# Patient Record
Sex: Female | Born: 1960 | Race: White | Hispanic: No | Marital: Married | Smoking: Never smoker
Health system: Southern US, Community
[De-identification: ages and names within clinical notes are randomized; demographics above are authoritative.]

## PROBLEM LIST (undated history)

## (undated) DIAGNOSIS — E079 Disorder of thyroid, unspecified: Secondary | ICD-10-CM

## (undated) DIAGNOSIS — G500 Trigeminal neuralgia: Secondary | ICD-10-CM

## (undated) DIAGNOSIS — T7840XA Allergy, unspecified, initial encounter: Secondary | ICD-10-CM

## (undated) DIAGNOSIS — N189 Chronic kidney disease, unspecified: Secondary | ICD-10-CM

## (undated) DIAGNOSIS — N200 Calculus of kidney: Secondary | ICD-10-CM

## (undated) DIAGNOSIS — M8580 Other specified disorders of bone density and structure, unspecified site: Secondary | ICD-10-CM

## (undated) HISTORY — DX: Trigeminal neuralgia: G50.0

## (undated) HISTORY — DX: Chronic kidney disease, unspecified: N18.9

## (undated) HISTORY — DX: Disorder of thyroid, unspecified: E07.9

## (undated) HISTORY — PX: LITHOTRIPSY: SUR834

## (undated) HISTORY — PX: OTHER SURGICAL HISTORY: SHX169

## (undated) HISTORY — DX: Allergy, unspecified, initial encounter: T78.40XA

## (undated) HISTORY — DX: Other specified disorders of bone density and structure, unspecified site: M85.80

## (undated) HISTORY — PX: CHOLECYSTECTOMY: SHX55

## (undated) HISTORY — DX: Calculus of kidney: N20.0

---

## 1998-09-10 ENCOUNTER — Other Ambulatory Visit: Admission: RE | Admit: 1998-09-10 | Discharge: 1998-09-10 | Payer: Self-pay | Admitting: *Deleted

## 1999-06-16 ENCOUNTER — Encounter (INDEPENDENT_AMBULATORY_CARE_PROVIDER_SITE_OTHER): Payer: Self-pay | Admitting: Specialist

## 1999-06-16 ENCOUNTER — Inpatient Hospital Stay (HOSPITAL_COMMUNITY): Admission: AD | Admit: 1999-06-16 | Discharge: 1999-06-19 | Payer: Self-pay | Admitting: Obstetrics and Gynecology

## 1999-07-14 ENCOUNTER — Other Ambulatory Visit: Admission: RE | Admit: 1999-07-14 | Discharge: 1999-07-14 | Payer: Self-pay | Admitting: Obstetrics and Gynecology

## 2000-07-26 ENCOUNTER — Other Ambulatory Visit: Admission: RE | Admit: 2000-07-26 | Discharge: 2000-07-26 | Payer: Self-pay | Admitting: Obstetrics and Gynecology

## 2000-07-31 ENCOUNTER — Encounter: Payer: Self-pay | Admitting: Obstetrics and Gynecology

## 2000-07-31 ENCOUNTER — Ambulatory Visit (HOSPITAL_COMMUNITY): Admission: RE | Admit: 2000-07-31 | Discharge: 2000-07-31 | Payer: Self-pay | Admitting: Obstetrics and Gynecology

## 2000-08-03 ENCOUNTER — Encounter: Admission: RE | Admit: 2000-08-03 | Discharge: 2000-08-03 | Payer: Self-pay | Admitting: Obstetrics and Gynecology

## 2000-08-03 ENCOUNTER — Encounter: Payer: Self-pay | Admitting: Obstetrics and Gynecology

## 2000-12-11 ENCOUNTER — Ambulatory Visit (HOSPITAL_COMMUNITY): Admission: RE | Admit: 2000-12-11 | Discharge: 2000-12-11 | Payer: Self-pay | Admitting: *Deleted

## 2001-08-09 ENCOUNTER — Other Ambulatory Visit: Admission: RE | Admit: 2001-08-09 | Discharge: 2001-08-09 | Payer: Self-pay | Admitting: Obstetrics and Gynecology

## 2001-08-16 ENCOUNTER — Encounter: Payer: Self-pay | Admitting: Obstetrics and Gynecology

## 2001-08-16 ENCOUNTER — Encounter: Admission: RE | Admit: 2001-08-16 | Discharge: 2001-08-16 | Payer: Self-pay | Admitting: Obstetrics and Gynecology

## 2002-08-24 ENCOUNTER — Other Ambulatory Visit: Admission: RE | Admit: 2002-08-24 | Discharge: 2002-08-24 | Payer: Self-pay | Admitting: Obstetrics and Gynecology

## 2003-04-07 ENCOUNTER — Ambulatory Visit (HOSPITAL_COMMUNITY): Admission: RE | Admit: 2003-04-07 | Discharge: 2003-04-07 | Payer: Self-pay | Admitting: Endocrinology

## 2003-04-18 ENCOUNTER — Encounter: Admission: RE | Admit: 2003-04-18 | Discharge: 2003-04-18 | Payer: Self-pay | Admitting: Obstetrics and Gynecology

## 2003-11-09 ENCOUNTER — Other Ambulatory Visit: Admission: RE | Admit: 2003-11-09 | Discharge: 2003-11-09 | Payer: Self-pay | Admitting: Obstetrics and Gynecology

## 2004-03-14 ENCOUNTER — Ambulatory Visit (HOSPITAL_COMMUNITY): Admission: RE | Admit: 2004-03-14 | Discharge: 2004-03-14 | Payer: Self-pay | Admitting: Endocrinology

## 2004-06-03 ENCOUNTER — Inpatient Hospital Stay (HOSPITAL_COMMUNITY): Admission: RE | Admit: 2004-06-03 | Discharge: 2004-06-10 | Payer: Self-pay | Admitting: Neurosurgery

## 2004-06-03 ENCOUNTER — Encounter (INDEPENDENT_AMBULATORY_CARE_PROVIDER_SITE_OTHER): Payer: Self-pay | Admitting: Specialist

## 2004-12-29 ENCOUNTER — Ambulatory Visit (HOSPITAL_COMMUNITY): Admission: RE | Admit: 2004-12-29 | Discharge: 2004-12-29 | Payer: Self-pay | Admitting: Obstetrics and Gynecology

## 2006-11-03 ENCOUNTER — Ambulatory Visit (HOSPITAL_COMMUNITY): Admission: RE | Admit: 2006-11-03 | Discharge: 2006-11-03 | Payer: Self-pay | Admitting: Obstetrics and Gynecology

## 2007-04-26 ENCOUNTER — Ambulatory Visit (HOSPITAL_COMMUNITY): Payer: Self-pay | Admitting: Psychiatry

## 2007-05-23 ENCOUNTER — Ambulatory Visit (HOSPITAL_COMMUNITY): Payer: Self-pay | Admitting: Psychiatry

## 2007-11-14 ENCOUNTER — Ambulatory Visit (HOSPITAL_COMMUNITY): Admission: RE | Admit: 2007-11-14 | Discharge: 2007-11-14 | Payer: Self-pay | Admitting: Obstetrics and Gynecology

## 2007-11-28 ENCOUNTER — Encounter: Admission: RE | Admit: 2007-11-28 | Discharge: 2007-11-28 | Payer: Self-pay | Admitting: Obstetrics and Gynecology

## 2008-05-02 ENCOUNTER — Ambulatory Visit (HOSPITAL_COMMUNITY): Payer: Self-pay | Admitting: Psychiatry

## 2008-10-24 ENCOUNTER — Ambulatory Visit (HOSPITAL_COMMUNITY): Payer: Self-pay | Admitting: Psychiatry

## 2009-02-11 ENCOUNTER — Encounter: Admission: RE | Admit: 2009-02-11 | Discharge: 2009-02-11 | Payer: Self-pay | Admitting: Obstetrics and Gynecology

## 2009-06-21 ENCOUNTER — Ambulatory Visit (HOSPITAL_COMMUNITY): Payer: Self-pay | Admitting: Psychiatry

## 2009-07-10 ENCOUNTER — Ambulatory Visit: Payer: Self-pay | Admitting: Radiology

## 2009-07-10 ENCOUNTER — Ambulatory Visit (HOSPITAL_BASED_OUTPATIENT_CLINIC_OR_DEPARTMENT_OTHER): Admission: RE | Admit: 2009-07-10 | Discharge: 2009-07-10 | Payer: Self-pay | Admitting: Obstetrics and Gynecology

## 2010-06-22 ENCOUNTER — Encounter: Payer: Self-pay | Admitting: Obstetrics and Gynecology

## 2010-06-23 ENCOUNTER — Encounter: Payer: Self-pay | Admitting: Obstetrics and Gynecology

## 2010-07-25 ENCOUNTER — Ambulatory Visit (HOSPITAL_COMMUNITY): Admission: RE | Admit: 2010-07-25 | Payer: 59 | Source: Ambulatory Visit | Admitting: Obstetrics and Gynecology

## 2010-09-03 ENCOUNTER — Ambulatory Visit (HOSPITAL_COMMUNITY): Payer: 59 | Admitting: Physician Assistant

## 2010-09-03 DIAGNOSIS — F988 Other specified behavioral and emotional disorders with onset usually occurring in childhood and adolescence: Secondary | ICD-10-CM

## 2010-10-08 ENCOUNTER — Ambulatory Visit (HOSPITAL_COMMUNITY): Payer: 59 | Admitting: Physician Assistant

## 2010-10-17 NOTE — H&P (Signed)
[facility]  Patient:    Gabriella Kerr                         MRN: [id number] Adm. Date:  [id number] Attending:  Lenoard Aden CC:         Lenoard Aden, M.D.                         History and Physical  CHIEF COMPLAINT:  Elective repeat cesarean section, desire for elective sterilization.  HISTORY OF PRESENT ILLNESS:  The patient is a 50 year old white female, G3, P2, EDD of June 30, 1999 at 28 to 29 weeks with desire for elective repeat C-section.  PAST MEDICAL HISTORY:  Remarkable for C-section x 2 in 1992 and 1994.  7 pound  ounce female in 1992, emergency fetal distress, failure to progress.  1998 8 pound 11 ounce female.  GYN HISTORY:  Remarkable for HPV.  ALLERGIES:  PENICILLIN.  MEDICATIONS ON ADMISSION:  Prenatal vitamins.  FAMILY HISTORY:  Thrombophlebitis and hypothyroidism.  The patient is a person ith an old history of nephrolithiasis.  PREGNANCY HISTORY:  Remarkable for blood type of A positive, Rh antibody negative. VDRL nonreactive.  Rubella immune.  Hepatitis B surface antigen negative. Group B strep performed was negative.  Pregnancy uncomplicated.  PHYSICAL EXAMINATION:  GENERAL:  The patient is a well-developed, well-nourished white female in no acute distress.  HEENT:  Normal.  LUNGS:  Clear.  HEART:  Regular rate and rhythm.  ABDOMEN:  Soft, gravid, nontender.  Estimated fetal weight 8 pounds.  No CVA tenderness.  Cervix is closed 3 cm long.  Vertex at -3.  EXTREMITIES:  No cords.  NEUROLOGIC:  Nonfocal.  IMPRESSION:  A 38-week intrauterine pregnancy with desire for elective repeat C-section.  PLAN:  Proceed with elective repeat C-section and tubal ligation.  Risks of anesthesia, infection, bleeding, injury to abdominal organs and need for repair was discussed.  Failure risks of tubal ligation of 5 to 10 per 1000 noted.  Patient  acknowledges and desires to proceed. DD:   06/16/99 TD:  06/16/99 Job: [id number]

## 2010-10-17 NOTE — Op Note (Signed)
[name], Gabriella NO.:  [id number]   MEDICAL RECORD NO.:  [id number]          PATIENT TYPE:  INP   LOCATION:  2899                         FACILITY:  [facility]   PHYSICIAN:  Hilda Lias, M.D.   DATE OF BIRTH:  [birth year]   DATE OF PROCEDURE:  06/03/2004  DATE OF DISCHARGE:                                 OPERATIVE REPORT   PREOPERATIVE DIAGNOSIS:  Pituitary tumor, right side, prolactinoma.   POSTOPERATIVE DIAGNOSIS:  Pituitary tumor, right side, prolactinoma.   PROCEDURE:  Transsphenoidal resection of pituitary adenoma, 10 x 10 x 12,  right side of the pituitary gland.  Microscope.  C-arm.   Gabriella Kerr is a lady who is admitted because of headache, breast discharge,  irregularity of her menstrual periods for more than four years.  The patient  had been taking medication, but the medication was making her sick.  She has  an MRI which showed adenoma in the right side of the pituitary gland.  The  patient wanted to proceed with surgery because she was unable to take  medication.  The risks were explained in the history and physical.   PROCEDURE:  The patient was taken to the O.R., and after intubation three  pins were applied to the head.  She was positioned in a semi-sitting  position.  C-arm was used, and we were able to localize the sella.  Then,  Dr. Lazarus Salines from the ENT service took over and opened the area.  He did the  approach into the sella.  From then on, I took over.  Indeed, the bone was  really thick, and we drilled part of the floor of the sella.  Then, using  the 1 and 2 mm Kerrison punch, we opened the sella about 10 x 10.  Then,  immediately we found in the inferior part of the right side of the pituitary  gland there was a discoloration which was different from the rest of the  area.  A cruciate incision was done in the dural matter, and the capsule was  opened.  Indeed, we found after we did the retraction there was an area  which was a  little bit of a dark color, different from the normal pituitary  gland.  Dissection was done around the capsule until we were able to remove  the tumor.  The tumor was not removed completely because part of the tumor  was necrotic.  Although we were unable to remove the tumor at once,  nevertheless then pulling out the pseudomembrane as well as the tumor itself  using microcurets with different angles, we were able to remove completely  the whole adenoma.  At the end, we were able to introduce a sponge.  Although I was doing surgery from the right side, I switched to the left  side and removed more tumor going lateral.  At the end, we had good removal  of adenoma.  Tissue came back.  It was a pituitary adenoma.  Gelfoam was  inserted into the area of the resection.  We kept it for at least  five  minutes.  At the end, we accomplished good hemostasis.  The Gelfoam ws  removed.  The area was irrigated.  There was no evidence of any CSF leak  after we did a Valsalva maneuver.  After that, we went into the right side  of the abdomen, and through the skin and subcutaneous tissue we removed a  piece of fat.  The area was irrigated, and hemostasis was done with bipolar.  We closed the wound using Vicryl.  Then, we  took a piece of the nasal bone, and we made a floor for the sella.  It was  introduced without any problem.  After that, we put a little bit of amount  of fat to seal off the place completely, and then the area was kept in place  using Tisseel.  From then on, Dr. Lazarus Salines took over to close the area.  The  patient did well.       EB/MEDQ  D:  06/03/2004  T:  06/03/2004  Job:  [id number]   cc:   Zola Button T. Lazarus Salines, M.D.  [address] [place]  [place] [address]  Fax: [phone]   Alfonse Alpers. Dagoberto Ligas, M.D.  [address], [address]  [place]  [place] [address]  Fax: [phone]

## 2010-10-17 NOTE — H&P (Signed)
NAMEAISLIN, Gabriella Kerr                 ACCOUNT NO.:  [id number]   MEDICAL RECORD NO.:  [id number]          PATIENT TYPE:  INP   LOCATION:  NA                           FACILITY:  [facility]   PHYSICIAN:  Hilda Lias, M.D.   DATE OF BIRTH:  1960-12-29   DATE OF ADMISSION:  06/03/2004  DATE OF DISCHARGE:                                HISTORY & PHYSICAL   Mrs. Bouler is a lady who has been complaining of a problem with her  menstrual periods, having __________ on the breasts, and lack of sexual  drive.  This problem started about 4-1/2 years ago after she had a daughter.  Associated with that, she had been complaining of a headache.  She had a  workup which showed that she has a pituitary tumor, and she was started on  Parlodel.  The patient had been unable to take the medication because that  increases the pain.  Because of the findings, she had been seen by the  endocrinologist, and she was advised to have surgery.   PAST MEDICAL HISTORY:  1.  C-section.  2.  Cholecystectomy.  3.  Rhinoplasty.   She is allergic to PENICILLIN.   She is taking Topamax; Effexor; Toradol; Imitrex; Demerol as needed; Maxalt;  and Vicodin.   SOCIAL HISTORY:  Negative.   FAMILY HISTORY:  Mother is 16 years old, overweight, and with high blood  pressure.   REVIEW OF SYSTEMS:  Positive for increase of urination, nipple discharge,  and lack of sexual drive.   PHYSICAL EXAMINATION:  HEENT:  Normal.  NECK:  Normal.  LUNGS: Clear.  HEART SOUNDS:  Normal.  ABDOMEN:  Normal.  EXTREMITIES:  Normal pulses.  NEUROLOGIC:  Mental status and cranial nerves normal.  Strength normal.  Sensation normal. Coordination normal.  BREASTS:  I did not test the breasts.  GYN:  No gynecological examination was performed.   The MRI showed that indeed in the right side of the gland she has a 10 x 9 x  8 mm tumor with low density with no compromise of the optic nerve.   CLINICAL IMPRESSION:  Pituitary tumor.   Prolactinoma.   RECOMMENDATIONS:  The patient wants to proceed with surgery.  The patient  was seen by Dr. Dagoberto Ligas as well as by Dr. Lazarus Salines from the ENT service.  She  wants to proceed with resection of the tumor here in [place].  The  complications were explained to her and her husband who was present, which  consisted of inability to remove the tumor, damage to the carotid artery,  damage to the optic nerve, sinusitis, meningitis, CSF leak, and need for  further surgery.  The patient declined second opinion.       EB/MEDQ  D:  06/03/2004  T:  06/03/2004  Job:  [id number]

## 2010-10-17 NOTE — Discharge Summary (Signed)
[name], [name]                 ACCOUNT NO.:  [id number]   MEDICAL RECORD NO.:  [id number]          PATIENT TYPE:  INP   LOCATION:  3022                         FACILITY:  [facility]   PHYSICIAN:  Hilda Lias, M.D.   DATE OF BIRTH:  22-Apr-1961   DATE OF ADMISSION:  06/03/2004  DATE OF DISCHARGE:  06/10/2004                                 DISCHARGE SUMMARY   PREOPERATIVE DIAGNOSES:  1.  Pituitary tumor.  2.  Right side adenoma.   POSTOPERATIVE DIAGNOSES:  1.  Pituitary tumor.  2.  Right side adenoma.   CLINICAL HISTORY:  Ms. Gabriella Kerr is a lady who was admitted because of headache,  discharge from her breast, irregularity to her menstrual period.  Patient  was given some Parlodel but she became sick with that medication giving more  headache.  Because of that, it was decided to take her to surgery.  The MRI  showed that she has a right-sided adenoma about 10 x 8 mm in the right side.  There was no displacement of the optic nerve.   LABORATORY:  Within normal limits.   COURSE IN THE HOSPITAL:  The patient was taken to surgery on June 03, 2004, and a transseptal, transsphenoidal resection of a right side pituitary  adenoma was made.  After surgery the patient was kept in the intensive care  unit and later on transferred to the floor.  At the present time she is  doing great, she has no headache.  There is no evidence of any diabetes  insipidus and she is ready to be discharged today.   CONDITION ON DISCHARGE:  Improved.   MEDICATION:  Percocet for pain.  She is going to continue taking her  medication she was using prior to being admitted to the hospital.   DIET:  Regular.   ACTIVITY:  She is not to do any lifting, she can drive.   FOLLOW UP:  I will be seeing her in my office in 4-6 weeks.  She was also  encouraged to set up an appointment with Dr. Dagoberto Ligas as well as with Dr.  __________.       EB/MEDQ  D:  06/10/2004  T:  06/10/2004  Job:  [id number]   cc:   Alfonse Alpers.  Dagoberto Ligas, M.D.  [address]  [place]  [place] [address]  Fax: [phone]   Maryla Morrow. Modesto Charon, M.D.  [address]  [place]  [place] [address]  Fax: [phone]   Lenoard Aden, M.D.  [address]  [place]  [place] [address]  Fax: [phone]

## 2010-10-17 NOTE — Discharge Summary (Signed)
[facility]  Patient:    Gabriella Kerr                         MRN: [id number] Adm. Date:  [id number] Disc. Date: [id number] Attending:  Lenoard Aden                           Discharge Summary  ADMISSION DIAGNOSES:          1. Previous C-section x 2.                               2. At term; for elective repeat and elective tubal                                  sterilization.  HOSPITAL COURSE:              Patient underwent uncomplicated elective repeat C-section and bilateral partial salpingectomy, June 16, 1999.  Postpartum course uncomplicated.  Tolerated regular diet well.  Hemoglobin and hematocrit within normal limits.  Discharged to home day #3.  DISCHARGE MEDICATIONS:        Prenatal vitamins, iron, Tylox and Motrin given.  DISCHARGE INSTRUCTIONS:       Discharge teaching done.  FOLLOWUP:                     Follow up in the office in four to six weeks. DD:  07/17/99 TD:  07/18/99 Job: [id number]

## 2010-10-17 NOTE — Op Note (Signed)
[facility]  Patient:    Gabriella Kerr                         MRN: [id number] Proc. Date: 06/16/99 Adm. Date:  [id number] Attending:  Lenoard Aden CC:         [facility]                           Operative Report  PREOPERATIVE DIAGNOSES:       Thirty-eight-week intrauterine pregnancy; previous cesarean section x 2; desire for elective sterilization.  POSTOPERATIVE DIAGNOSES:      Thirty-eight-week intrauterine pregnancy; previous cesarean section x 2; desire for elective sterilization.  PROCEDURE:                    Repeat low segment transverse cesarean section and bilateral partial salpingectomy.  SURGEON:                      Lenoard Aden, M.D.  ASSISTANT:                    Hayes Ludwig, N.P.  ANESTHESIA:  ESTIMATED BLOOD LOSS:         1000 cc.  COMPLICATIONS:                None.  DRAINS:                       Foley.  COUNTS:                       Correct.  FINDINGS:                     Full-term living female, Apgars 8/9.  Placenta manual, intact, three-vessel cord; with normal tubes and ovaries.  SPECIMEN:                     Tubal segments x 2 to pathology.  DISPOSITION:                  Patient to recovery in good condition.  DESCRIPTION OF PROCEDURE:     After being apprised of risks of anesthesia, infection and bleeding, failure risk of tubal ligation of 5 to 10 per thousand,  patient is brought to the operating room where she is administered spinal anesthetic without complications and prepped and draped in the usual sterile fashion and Foley catheter placed.  After achieving adequate anesthesia, Pfannenstiel skin incision is made and carried down to the fascia, which is nicked in the midline, and opened transversely using Mayo scissors.  Rectus muscle are  dissected sharply in the midline, peritoneum entered sharply, and bladder blade  placed.  The visceroperitoneum was scored in a smile-like fashion  and uterus is  scored, making a Kerr hysterotomy incision, for a vacuum-assisted delivery of a  7-pound 11-ounce female, Apgars 8/9, left occipitotransverse position, handed to pediatricians who are in attendance.  Placenta delivered manually and intact; three-vessel cord noted.  Uterus exteriorized; normal tubes and ovaries noted.  Uterus curetted using a dry lap pack and closed using a 0 Monocryl in a continuous-running fashion.  Bladder flap inspected.  Ampullary-isthmic portion of the right tube is grasped using a Babcock clamp and an avascular portion of the  mesosalpinx is cauterized, creating a window.  Proximal  and distal ties of 0 plain are placed and good hemostasis is achieved as the tubal segment is removed and oth tubal ends are cauterized.  The same procedure is done on the left side and tubal segment is removed and sent to pathology for confirmation.  At this time, good hemostasis having been achieved, paracolic gutters are irrigated and all blood clots are subsequently removed.  Fascia is then closed using a 0 Vicryl in a continuous-running fashion and skin closed using staples.  Dilute Marcaine solution placed, staples placed.  Patient tolerates procedure well and is transferred to  recovery in good condition.DD:  06/16/99 TD:  06/16/99 Job: [id number]

## 2010-10-17 NOTE — Op Note (Signed)
Gabriella Kerr, PLUTA                 ACCOUNT NO.:  [id number]   MEDICAL RECORD NO.:  [id number]          PATIENT TYPE:  INP   LOCATION:  2899                         FACILITY:  [facility]   PHYSICIAN:  Zola Button T. Lazarus Salines, M.D. DATE OF BIRTH:  1960-11-13   DATE OF PROCEDURE:  06/03/2004  DATE OF DISCHARGE:                                 OPERATIVE REPORT   PREOPERATIVE DIAGNOSIS:  Pituitary tumor. Hypertrophic inferior turbinates.   POSTOPERATIVE DIAGNOSIS:  Pituitary tumor. Hypertrophic inferior turbinates.   PROCEDURE PERFORMED:  Transnasal, transseptal transsphenoidal approach to  the hypophysis. Bilateral inferior turbinates.   SURGEON:  Gloris [place]. Lazarus Salines, M.D.   ANESTHESIA:  General orotracheal.   BLOOD LOSS:  200 cc.   COMPLICATIONS:  None.   FINDINGS:  Basically straight septum. A congested inferior turbinates  bilateral. A clear sphenoid sinus with a intersinus septum off midline  towards the left. No evidence of bony erosion of the floor of the sella.   PROCEDURE:  With the patient placed in the head tongs and positioned in  front of the C-arm apparatus, under general orotracheal anesthesia,  otolaryngology began their portion of the procedure. A saline moistened  throat pack was placed. Nasal vibrissae were trimmed. Cocaine crystals, 200  mg total were applied on cotton carriers to the anterior ethmoid and  sphenopalatine ganglion regions on both sides. Cocaine solution, 160  milligrams total was applied on one half x 3 inch cottonoids to both sides  of the septal mucosa. 1% Xylocaine with 1:100,000 epinephrine, 15 mL  total  infiltrated into the nasal spine region, into the anterior floor of the  nose, into the transcolumellar incision site, into the membranous columella,  and some mucoperichondrial plane of the septum on both sides. Several  minutes were allowed for  this to take effect. A sterile preparation and  draping of the midface was accomplished.   The materials  were removed from the nose and appeared to be intact and  correct in number. The findings were as described above. A left-sided  hemitransfixion incision was sharply executed and carried down to the caudal  edge of the quadrangular cartilage and continued on into a floor incision. A  small right-sided floor incision was executed. Floor tunnels were elevated  on both sides. The sub mucoperichondrial plane of the left septum was  elevated, carried back to the perpendicular plate of ethmoid, brought  downward to the vomer and brought forward and communicated with the floor  tunnel. The left flap was elevated intact. The contour ethmoid junction was  identified and opened with the Cottle elevator. The opposite  submucoperiosteal plane of the septum was elevated from the superior  perpendicular plate down to the dorsum of the vomer. The superior  perpendicular plate of the ethmoid was laced with an open Jansen-Middleton  forceps.  The inferior portion was dissected and rocked free with closed  Jansen-Middleton forceps and with Lenoria Chime forceps piecemeal. The posterior  inferior corner of the quadrangular cartilage was submucosally resected. The  cartilage was dislocated from the maxillary crest and swung over to the  right  where it communicated with the floor tunnel.   The septum was replaced in midline. The columella was carefully cleaned. A  transcolumellar incision was planned low on the columella with a central V  to prevent scar contracture. This was sharply executed and carried into the  soft tissue of the columella and beneath the feet of the medial crurae of  the lower lateral cartilages. The columella was transected and this is line  of incision was communicated with the right floor tunnel incision. This  allowed the septum and columella to be swung into the right nose and the  septal flap into the left side. This allowed access to the deep nose. Using  a Killian  speculum, the  dissection was carried back to the rostrum of the  sphenoid where the mucosa was cleared away from the face of the sphenoid.  This Killian speculum was removed and the [facility] retractor was placed and  expanded for visualization. It was stable. Under direct vision, using  mallet and osteotome, the rostrum of the sphenoid was opened and removed.  The sphenoid sinus was visualized. The bony opening was enlarged with  Takahashi forceps and Kerrison rongeurs. The intrasinus septum was broken  down with the [facility] forceps. The mucosa from the right sphenoid sinus  was carefully elevated and stripped going from medially to laterally. The  left sphenoid sinus was substantially smaller and was impossible to get a  good dissection of the mucosa so it was incompletely stripped. At this point  the anterior face of the sella was identified. A suction tip was placed  against this area, and positioning was confirmed with C-arm. At this point  the procedure was turned over to neurosurgery for removal of the pituitary  tumor.   After complete removal of the pituitary, and placement of a small amount of  fat and bone chips in the sella and the same in the sinus, the procedure was  returned otolaryngology. A small amount of blood was suctioned free of the  septal tunnels. The Hardy retractor was relaxed and removed. There was a  small linear rent in the posterior inferior right septal flap.   The columella was replaced and secured with interrupted 4-0 chromic sutures  in the deep tissues and with 4-0 chromic suture in the vestibule and 6-0  Ethilon suture externally in a cosmetic fashion. The septum was replaced in  the nasal spine region and secured thereto with a 4-0 PDS suture. The septal  tunnels were once again suctioned free and the several incisions were closed  with interrupted 4-0 chromic suture. A good straight midline configuration of the septum was accomplished.   Just prior to completing  the septoplasty, the inferior turbinates were  infiltrated with a total of 6 mL  of 1% Xylocaine with 1:100,000 epinephrine  using a 25 gauge spinal needle. After completing the septoplasty, beginning  on the right side, the anterior hood of the inferior turbinate was laced to  span nasal valve region. The medial mucosa was incised and anterior  upsloping fashion and a laterally based flap was developed. The turbinate  was infractured. Using angled turbinate scissors, the turbinate bone and  lateral mucosa was resected in a posterior downsloping fashion taking  virtually all the anterior pole and leaving virtually all the posterior  pole. Bony spicules were removed. The cut mucosal edges were suction  coagulated for hemostasis. The flap was laid back down, the turbinate was  outfractured, and this side was completed.  The left side was done in  identical fashion.   At this point 0.040 reinforced silastic splints were fashioned and placed  against the septum on both sides and secured thereto with a 3-0 Ethilon  stitch. Triple thickness Telfa packs impregnated with bacitracin ointment  and with a 2-0 silk tag were placed high in the nose up against the rostrum  of the sphenoid. Additional triple thickness Telfa packs without the silk  tag were placed low in the nose on each side to provide some compression  hemostasis of the turbinates and septum. Hemostasis was observed. At this  point the pharynx was suctioned free and throat pack was removed. Again  hemostasis was observed. At this point, the procedure was to returned to  neurosurgery for removal of the head tongs and then to anesthesia.   COMMENT:  A 50 year old white female with a presumed prolactinoma but with  intolerance of bromocriptine was the indication for today's procedure.  Anticipated routine postoperative recovery with attention to ice, elevation,  analgesia, antistaphylococcal antibiosis. Will remove the nasal packs in   seven days. Nasal splints in 10 days.      [place]   KTW/MEDQ  D:  06/03/2004  T:  06/03/2004  Job:  [id number]   cc:   Hilda Lias, M.D.  [address]  [place], [place] [address]  Fax: [phone]   Maryla Morrow. Modesto Charon, M.D.  [address]  [place]  [place] [address]  Fax: [phone]   Lenoard Aden, M.D.  [address]  [place]  [place] [address]  Fax: [phone]

## 2010-11-17 ENCOUNTER — Other Ambulatory Visit (HOSPITAL_BASED_OUTPATIENT_CLINIC_OR_DEPARTMENT_OTHER): Payer: Self-pay | Admitting: Obstetrics and Gynecology

## 2010-11-17 DIAGNOSIS — Z1231 Encounter for screening mammogram for malignant neoplasm of breast: Secondary | ICD-10-CM

## 2010-11-18 ENCOUNTER — Ambulatory Visit (HOSPITAL_BASED_OUTPATIENT_CLINIC_OR_DEPARTMENT_OTHER)
Admission: RE | Admit: 2010-11-18 | Discharge: 2010-11-18 | Disposition: A | Discharge status: Home / Self Care | Payer: 59 | Source: Ambulatory Visit | Attending: Obstetrics and Gynecology | Admitting: Obstetrics and Gynecology

## 2010-11-18 DIAGNOSIS — Z1231 Encounter for screening mammogram for malignant neoplasm of breast: Secondary | ICD-10-CM

## 2011-01-24 ENCOUNTER — Emergency Department (HOSPITAL_BASED_OUTPATIENT_CLINIC_OR_DEPARTMENT_OTHER)
Admission: EM | Admit: 2011-01-24 | Discharge: 2011-01-25 | Disposition: A | Discharge status: Home / Self Care | Payer: 59 | Attending: Emergency Medicine | Admitting: Emergency Medicine

## 2011-01-24 ENCOUNTER — Encounter: Payer: Self-pay | Admitting: *Deleted

## 2011-01-24 DIAGNOSIS — R110 Nausea: Secondary | ICD-10-CM | POA: Insufficient documentation

## 2011-01-24 DIAGNOSIS — R109 Unspecified abdominal pain: Secondary | ICD-10-CM | POA: Insufficient documentation

## 2011-01-24 DIAGNOSIS — N201 Calculus of ureter: Secondary | ICD-10-CM | POA: Insufficient documentation

## 2011-01-24 MED ORDER — ONDANSETRON HCL 4 MG/2ML IJ SOLN
4.0000 mg | Freq: Once | INTRAMUSCULAR | Status: AC
  Administered 2011-01-25: 4 mg via INTRAVENOUS
  Filled 2011-01-24: qty 2

## 2011-01-24 MED ORDER — SODIUM CHLORIDE 0.9 % IV BOLUS (SEPSIS)
500.0000 mL | Freq: Once | INTRAVENOUS | Status: AC
  Administered 2011-01-25: via INTRAVENOUS

## 2011-01-24 MED ORDER — MORPHINE SULFATE 4 MG/ML IJ SOLN
4.0000 mg | Freq: Once | INTRAMUSCULAR | Status: AC
  Administered 2011-01-25: 4 mg via INTRAVENOUS
  Filled 2011-01-24: qty 1

## 2011-01-24 NOTE — ED Notes (Signed)
Abd pain since 11am. LLQ, LRQ. Radiates to back towards left. Tender upon palpation.

## 2011-01-25 ENCOUNTER — Emergency Department (INDEPENDENT_AMBULATORY_CARE_PROVIDER_SITE_OTHER): Payer: 59

## 2011-01-25 DIAGNOSIS — N201 Calculus of ureter: Secondary | ICD-10-CM

## 2011-01-25 DIAGNOSIS — N1330 Unspecified hydronephrosis: Secondary | ICD-10-CM

## 2011-01-25 DIAGNOSIS — R109 Unspecified abdominal pain: Secondary | ICD-10-CM

## 2011-01-25 DIAGNOSIS — N200 Calculus of kidney: Secondary | ICD-10-CM

## 2011-01-25 LAB — URINALYSIS, ROUTINE W REFLEX MICROSCOPIC
Glucose, UA: NEGATIVE mg/dL
Leukocytes, UA: NEGATIVE
Nitrite: NEGATIVE
Specific Gravity, Urine: 1.025 (ref 1.005–1.030)
pH: 7 (ref 5.0–8.0)

## 2011-01-25 LAB — CBC
HCT: 35.2 % — ABNORMAL LOW (ref 36.0–46.0)
Hemoglobin: 11.8 g/dL — ABNORMAL LOW (ref 12.0–15.0)
RDW: 12.6 % (ref 11.5–15.5)
WBC: 7.8 10*3/uL (ref 4.0–10.5)

## 2011-01-25 LAB — DIFFERENTIAL
Basophils Absolute: 0 10*3/uL (ref 0.0–0.1)
Lymphocytes Relative: 11 % — ABNORMAL LOW (ref 12–46)
Monocytes Absolute: 0.4 10*3/uL (ref 0.1–1.0)
Neutro Abs: 6.5 10*3/uL (ref 1.7–7.7)
Neutrophils Relative %: 84 % — ABNORMAL HIGH (ref 43–77)

## 2011-01-25 LAB — BASIC METABOLIC PANEL
CO2: 21 mEq/L (ref 19–32)
Chloride: 110 mEq/L (ref 96–112)
Creatinine, Ser: 0.8 mg/dL (ref 0.50–1.10)
GFR calc Af Amer: >60 mL/min (ref >60)
Potassium: 3.7 mEq/L (ref 3.5–5.1)
Sodium: 140 mEq/L (ref 135–145)

## 2011-01-25 LAB — URINE MICROSCOPIC-ADD ON

## 2011-01-25 LAB — PREGNANCY, URINE: Preg Test, Ur: NEGATIVE

## 2011-01-25 MED ORDER — IOHEXOL 300 MG/ML  SOLN
100.0000 mL | Freq: Once | INTRAMUSCULAR | Status: AC | PRN
  Administered 2011-01-25: 100 mL via INTRAVENOUS

## 2011-01-25 MED ORDER — HYDROMORPHONE HCL 1 MG/ML IJ SOLN
0.5000 mg | Freq: Once | INTRAMUSCULAR | Status: AC
  Administered 2011-01-25: 0.5 mg via INTRAVENOUS
  Filled 2011-01-25: qty 1

## 2011-01-25 MED ORDER — KETOROLAC TROMETHAMINE 30 MG/ML IJ SOLN
30.0000 mg | Freq: Once | INTRAMUSCULAR | Status: AC
  Administered 2011-01-25: 30 mg via INTRAVENOUS
  Filled 2011-01-25: qty 1

## 2011-01-25 MED ORDER — OXYCODONE-ACETAMINOPHEN 5-325 MG PO TABS: 2.0000 | ORAL_TABLET | ORAL | Status: AC | PRN

## 2011-01-25 MED ORDER — OXYCODONE-ACETAMINOPHEN 5-325 MG PO TABS
ORAL_TABLET | ORAL | Status: AC
  Filled 2011-01-25: qty 1

## 2011-01-25 MED ORDER — TAMSULOSIN HCL 0.4 MG PO CAPS: 0.4000 mg | ORAL_CAPSULE | ORAL | Status: DC

## 2011-01-25 MED ORDER — OXYCODONE-ACETAMINOPHEN 5-325 MG PO TABS
1.0000 | ORAL_TABLET | Freq: Once | ORAL | Status: AC
  Administered 2011-01-25: 1 via ORAL

## 2011-01-25 MED ORDER — ONDANSETRON 8 MG PO TBDP: 8.0000 mg | ORAL_TABLET | Freq: Three times a day (TID) | ORAL | Status: AC | PRN

## 2011-01-25 NOTE — ED Provider Notes (Signed)
History     CSN: [id number] Arrival date & time: 01/24/2011 11:46 PM  Chief Complaint  Patient presents with  . Abdominal Pain   HPI Comments: Pt with h/o cholecystectomy Reports gradual onset of illness today  Patient is a 49 y.o. female presenting with abdominal pain. The history is provided by the patient.  Abdominal Pain The primary symptoms of the illness include abdominal pain and nausea. The primary symptoms of the illness do not include fever, shortness of breath, vomiting, diarrhea, dysuria or vaginal bleeding. The current episode started 13 to 24 hours ago. The onset of the illness was gradual. The problem has been gradually worsening.  The patient states that she believes she is currently not pregnant. Additional symptoms associated with the illness include chills.    Past Medical History  Diagnosis Date  . Migraine     Past Surgical History  Procedure Date  . Cesarean section     History reviewed. No pertinent family history.  History  Substance Use Topics  . Smoking status: Never Smoker   . Smokeless tobacco: Not on file  . Alcohol Use: No    OB History    Grav Para Term Preterm Abortions TAB SAB Ect Mult Living                  Review of Systems  Constitutional: Positive for chills. Negative for fever.  Respiratory: Negative for shortness of breath.   Gastrointestinal: Positive for nausea and abdominal pain. Negative for vomiting and diarrhea.  Genitourinary: Negative for dysuria and vaginal bleeding.  All other systems reviewed and are negative.    Physical Exam  BP 121/64  Pulse 66  Temp(Src) 96.6 F (35.9 C) (Oral)  Resp 20  Ht 5\' 7"  (1.702 m)  Wt 135 lb (61.236 kg)  BMI 21.14 kg/m2  SpO2 100%  LMP 01/17/2011  Physical Exam  CONSTITUTIONAL: Well developed/well nourished, uncomfortable appearing HEAD AND FACE: Normocephalic/atraumatic EYES: EOMI/PERRL ENMT: Mucous membranes dry NECK: supple no meningeal signs SPINE:entire spine  nontender CV: S1/S2 noted, no murmurs/rubs/gallops noted LUNGS: Lungs are clear to auscultation bilaterally, no apparent distress ABDOMEN: soft but diffuse RLQ and LLQ tenderness noted, no rebound, no peritoneal signs GU:no cva tenderness NEURO: Pt is awake/alert, moves all extremitiesx4 EXTREMITIES: pulses normal, full ROM SKIN: warm, color normal PSYCH: no abnormalities of mood noted   ED Course  Procedures  MDM Nursing notes reviewed and considered in documentation All labs/vitals reviewed and considered - hematuria noted   12:12 AM pt ill appearing with diffuse abd pain.  Will likely need ct imaging   1:48 AM  Pt feeling improved, awaiting Ct imaging   Pt improved, taking PO Discussed need for close urology f/u in 24 hours as large stone noted, but pain controlled, no uti, no fever Pt agreeable with plan and we discussed strict return precautions 4:22 AM   Joya Gaskins, MD 01/25/11 [phone]

## 2011-01-29 ENCOUNTER — Ambulatory Visit (HOSPITAL_COMMUNITY)
Admission: RE | Admit: 2011-01-29 | Discharge: 2011-01-29 | Disposition: A | Discharge status: Home / Self Care | Payer: 59 | Source: Ambulatory Visit | Attending: Urology | Admitting: Urology

## 2011-01-29 DIAGNOSIS — N201 Calculus of ureter: Secondary | ICD-10-CM | POA: Insufficient documentation

## 2011-03-04 ENCOUNTER — Encounter (HOSPITAL_COMMUNITY): Payer: 59 | Admitting: Physician Assistant

## 2011-03-04 DIAGNOSIS — F909 Attention-deficit hyperactivity disorder, unspecified type: Secondary | ICD-10-CM

## 2011-06-04 ENCOUNTER — Other Ambulatory Visit (HOSPITAL_COMMUNITY): Payer: Self-pay | Admitting: *Deleted

## 2011-06-04 DIAGNOSIS — F909 Attention-deficit hyperactivity disorder, unspecified type: Secondary | ICD-10-CM

## 2011-06-04 MED ORDER — LISDEXAMFETAMINE DIMESYLATE 60 MG PO CAPS: 60.0000 mg | ORAL_CAPSULE | ORAL | Status: DC

## 2011-08-06 ENCOUNTER — Other Ambulatory Visit (HOSPITAL_BASED_OUTPATIENT_CLINIC_OR_DEPARTMENT_OTHER): Payer: Self-pay | Admitting: Obstetrics and Gynecology

## 2011-08-06 DIAGNOSIS — Z1231 Encounter for screening mammogram for malignant neoplasm of breast: Secondary | ICD-10-CM

## 2011-09-02 ENCOUNTER — Ambulatory Visit (INDEPENDENT_AMBULATORY_CARE_PROVIDER_SITE_OTHER): Payer: 59 | Admitting: Physician Assistant

## 2011-09-02 ENCOUNTER — Other Ambulatory Visit (HOSPITAL_COMMUNITY): Payer: Self-pay | Admitting: *Deleted

## 2011-09-02 DIAGNOSIS — F909 Attention-deficit hyperactivity disorder, unspecified type: Secondary | ICD-10-CM

## 2011-09-02 DIAGNOSIS — F988 Other specified behavioral and emotional disorders with onset usually occurring in childhood and adolescence: Secondary | ICD-10-CM

## 2011-09-02 MED ORDER — LISDEXAMFETAMINE DIMESYLATE 60 MG PO CAPS: 60.0000 mg | ORAL_CAPSULE | ORAL | Status: DC

## 2011-09-02 NOTE — Progress Notes (Signed)
   [facility] Health Follow-up Outpatient Visit  KRYSTIE LEITER 04-May-1961  Date: 09/02/2011   Subjective: Gelila presents today to follow up on her medications prescribed for ADHD. She reports that her father died in a car crash in 2023/05/27 of last year, and she has been struggling with that loss. She began seeing Berniece Andreas for therapy, which she feels has been partially helpful. She expresses some resentment toward Raynelle Fanning because she was unable to make her last appointment and gave less than 24 hour notice, and was billed $50 for a broken appointment. She is being prescribed Cymbalta Topamax and Ambien by another provider. The Cymbalta and Topamax are for headache pain. She reports that the Vyvanse continues to work well. She has no mood instability. Her sleep and appetite are good.  There were no vitals filed for this visit.  Mental Status Examination  Appearance: Well groomed and dressed Alert: Yes Attention: good  Cooperative: Yes Eye Contact: Good Speech: Clear and even Psychomotor Activity: Normal Memory/Concentration: Intact Oriented: person, place, time/date and situation Mood: Dysphoric Affect: Congruent Thought Processes and Associations: Circumstantial Fund of Knowledge: Fair Thought Content: Normal Insight: Good Judgement: Good  Diagnosis: ADHD inattentive type  Treatment Plan: We will continue the Vyvanse at 60 mg daily and she will followup in 6 months. I recommended that she contact Berniece Andreas for closure, and that if she needs recounts lying to contact Hospice.  Randel Hargens, PA-C

## 2011-11-23 ENCOUNTER — Ambulatory Visit (HOSPITAL_BASED_OUTPATIENT_CLINIC_OR_DEPARTMENT_OTHER)
Admission: RE | Admit: 2011-11-23 | Discharge: 2011-11-23 | Disposition: A | Discharge status: Home / Self Care | Payer: 59 | Source: Ambulatory Visit | Attending: Obstetrics and Gynecology | Admitting: Obstetrics and Gynecology

## 2011-11-23 DIAGNOSIS — Z1231 Encounter for screening mammogram for malignant neoplasm of breast: Secondary | ICD-10-CM | POA: Insufficient documentation

## 2011-12-17 ENCOUNTER — Other Ambulatory Visit (HOSPITAL_COMMUNITY): Payer: Self-pay | Admitting: *Deleted

## 2011-12-17 DIAGNOSIS — F909 Attention-deficit hyperactivity disorder, unspecified type: Secondary | ICD-10-CM

## 2011-12-17 MED ORDER — LISDEXAMFETAMINE DIMESYLATE 60 MG PO CAPS: 60.0000 mg | ORAL_CAPSULE | ORAL | Status: DC

## 2011-12-21 ENCOUNTER — Other Ambulatory Visit (HOSPITAL_COMMUNITY): Payer: Self-pay | Admitting: Physician Assistant

## 2012-03-03 ENCOUNTER — Ambulatory Visit (HOSPITAL_COMMUNITY): Payer: 59 | Admitting: Physician Assistant

## 2012-03-15 ENCOUNTER — Other Ambulatory Visit (HOSPITAL_COMMUNITY): Payer: Self-pay

## 2012-03-15 DIAGNOSIS — F909 Attention-deficit hyperactivity disorder, unspecified type: Secondary | ICD-10-CM

## 2012-03-15 MED ORDER — LISDEXAMFETAMINE DIMESYLATE 60 MG PO CAPS: 60.0000 mg | ORAL_CAPSULE | ORAL | Status: DC

## 2012-03-30 ENCOUNTER — Ambulatory Visit (INDEPENDENT_AMBULATORY_CARE_PROVIDER_SITE_OTHER): Payer: 59 | Admitting: Physician Assistant

## 2012-03-30 DIAGNOSIS — F988 Other specified behavioral and emotional disorders with onset usually occurring in childhood and adolescence: Secondary | ICD-10-CM | POA: Insufficient documentation

## 2012-03-30 NOTE — Progress Notes (Signed)
   [facility] Health Follow-up Outpatient Visit  ARVIE VILLARRUEL Dec 18, 1960  Date: 03/30/2012   Subjective: Jamyiah presents today to followup on her treatment for ADHD. She reports that things are going very well for her. 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 Her daughter has gotten engaged to be married. She wonders if her middle daughter may be ADHD and would like to have her evaluated.  There were no vitals filed for this visit.  Mental Status Examination  Appearance: Well groomed and nicely dressed Alert: Yes Attention: good  Cooperative: Yes Eye Contact: Good Speech: Clear and coherent Psychomotor Activity: Normal Memory/Concentration: Intact Oriented: person, place, time/date and situation Mood: Euthymic Affect: Appropriate Thought Processes and Associations: Linear Fund of Knowledge: Good Thought Content: Normal Insight: Good Judgement: Good  Diagnosis: ADHD, inattentive type  Treatment Plan: Continue Vyvanse 60 mg daily and followup in 6 months.  Oree Hislop, PA-C

## 2012-05-30 ENCOUNTER — Encounter (HOSPITAL_COMMUNITY): Payer: Self-pay | Admitting: *Deleted

## 2012-05-30 ENCOUNTER — Other Ambulatory Visit (HOSPITAL_COMMUNITY): Payer: Self-pay | Admitting: *Deleted

## 2012-05-30 DIAGNOSIS — F909 Attention-deficit hyperactivity disorder, unspecified type: Secondary | ICD-10-CM

## 2012-05-30 NOTE — Progress Notes (Signed)
Vyvanse authorized by [facility] for 12 months-- 05/30/12 to 05/30/13 Notified pt 05/30/12 at [phone]

## 2012-05-30 NOTE — Telephone Encounter (Signed)
According to [facility], pt last received a 90 day RX on 10/15 for Vyvanse, will need to be refilled on or about 06/15/12. Contacted pt to inquire as to the status of this RX, waiting on call back

## 2012-06-13 ENCOUNTER — Other Ambulatory Visit (HOSPITAL_COMMUNITY): Payer: Self-pay | Admitting: *Deleted

## 2012-06-13 DIAGNOSIS — F909 Attention-deficit hyperactivity disorder, unspecified type: Secondary | ICD-10-CM

## 2012-06-13 MED ORDER — LISDEXAMFETAMINE DIMESYLATE 60 MG PO CAPS: 60.0000 mg | ORAL_CAPSULE | ORAL | Status: DC

## 2012-09-07 ENCOUNTER — Other Ambulatory Visit (HOSPITAL_COMMUNITY): Payer: Self-pay | Admitting: *Deleted

## 2012-09-07 DIAGNOSIS — F909 Attention-deficit hyperactivity disorder, unspecified type: Secondary | ICD-10-CM

## 2012-09-07 MED ORDER — LISDEXAMFETAMINE DIMESYLATE 60 MG PO CAPS: 60.0000 mg | ORAL_CAPSULE | ORAL | Status: DC

## 2012-09-28 ENCOUNTER — Ambulatory Visit (HOSPITAL_COMMUNITY): Payer: 59 | Admitting: Physician Assistant

## 2012-11-10 ENCOUNTER — Ambulatory Visit (HOSPITAL_COMMUNITY): Payer: Self-pay | Admitting: Physician Assistant

## 2012-11-15 ENCOUNTER — Ambulatory Visit (INDEPENDENT_AMBULATORY_CARE_PROVIDER_SITE_OTHER): Payer: 59 | Admitting: Physician Assistant

## 2012-11-15 VITALS — BP 132/82 | HR 81 | Ht 67.0 in | Wt 126.6 lb

## 2012-11-15 DIAGNOSIS — F988 Other specified behavioral and emotional disorders with onset usually occurring in childhood and adolescence: Secondary | ICD-10-CM

## 2012-11-15 MED ORDER — AMPHETAMINE-DEXTROAMPHETAMINE 5 MG PO TABS: 5.0000 mg | ORAL_TABLET | Freq: Every day | ORAL | Status: DC

## 2012-11-17 ENCOUNTER — Encounter (HOSPITAL_COMMUNITY): Payer: Self-pay | Admitting: Physician Assistant

## 2012-11-17 NOTE — Progress Notes (Signed)
   [facility] Health Follow-up Outpatient Visit  Gabriella Kerr 05-Oct-1960  Date: 11/15/2012   Subjective: [name] presents today to followup on her treatment for her ADHD. She reports that overall she is doing well. She would like for her Vyvanse to last a little longer. She states that she takes at 7 AM and it wears off around 6 or 7 PM. She reports that when it wears off she becomes irritable and she feels exhausted. She denies that she is depressed. She reports that her sleep and appetite are good.  She happily reports that on May 17 her daughter married and is now living in [place].  Filed Vitals:   11/17/12 1009  BP: 132/82  Pulse: 81    Mental Status Examination  Appearance: well groomed and nicely dressed Alert: Yes Attention: good  Cooperative: Yes Eye Contact: Good Speech: clear and coherent Psychomotor Activity: Normal Memory/Concentration: intact Oriented: person, place, time/date and situation Mood: Euthymic , mildly anxious Affect: Congruent Thought Processes and Associations: Linear Fund of Knowledge: Good Thought Content: normal Insight: Good Judgement: Good  Diagnosis: ADHD, inattentive type  Treatment Plan: we will continue her Vyvanse 60 mg daily, and start her on Adderall 5 mg to take in the evening on an as-needed basis. She will return for followup in 2 months.  Delana Manganello, PA-C

## 2012-11-22 ENCOUNTER — Encounter (HOSPITAL_COMMUNITY): Payer: Self-pay | Admitting: *Deleted

## 2012-11-22 NOTE — Progress Notes (Signed)
Adderall 5 mg, one daily,  authorized by [employer] for 12 months beginning 11/16/12

## 2012-11-29 ENCOUNTER — Other Ambulatory Visit (HOSPITAL_COMMUNITY): Payer: Self-pay | Admitting: *Deleted

## 2012-11-29 DIAGNOSIS — F909 Attention-deficit hyperactivity disorder, unspecified type: Secondary | ICD-10-CM

## 2012-11-29 MED ORDER — LISDEXAMFETAMINE DIMESYLATE 60 MG PO CAPS: 60.0000 mg | ORAL_CAPSULE | ORAL | Status: DC

## 2012-11-30 ENCOUNTER — Telehealth (HOSPITAL_COMMUNITY): Payer: Self-pay

## 2012-11-30 NOTE — Telephone Encounter (Signed)
11/30/12  Patient came and pick-up rx script.Gabriella KitchenMarguerite Kerr

## 2013-01-23 ENCOUNTER — Ambulatory Visit (HOSPITAL_COMMUNITY): Payer: Self-pay | Admitting: Physician Assistant

## 2013-01-31 ENCOUNTER — Encounter (HOSPITAL_COMMUNITY): Payer: Self-pay | Admitting: Physician Assistant

## 2013-01-31 ENCOUNTER — Ambulatory Visit (INDEPENDENT_AMBULATORY_CARE_PROVIDER_SITE_OTHER): Payer: 59 | Admitting: Physician Assistant

## 2013-01-31 VITALS — BP 126/70 | HR 92 | Ht 67.0 in | Wt 125.2 lb

## 2013-01-31 DIAGNOSIS — F988 Other specified behavioral and emotional disorders with onset usually occurring in childhood and adolescence: Secondary | ICD-10-CM

## 2013-01-31 DIAGNOSIS — F411 Generalized anxiety disorder: Secondary | ICD-10-CM | POA: Insufficient documentation

## 2013-01-31 DIAGNOSIS — F909 Attention-deficit hyperactivity disorder, unspecified type: Secondary | ICD-10-CM

## 2013-01-31 MED ORDER — DULOXETINE HCL 60 MG PO CPEP: 120.0000 mg | ORAL_CAPSULE | Freq: Every day | ORAL | Status: DC

## 2013-01-31 MED ORDER — LISDEXAMFETAMINE DIMESYLATE 60 MG PO CAPS: 60.0000 mg | ORAL_CAPSULE | ORAL | Status: DC

## 2013-01-31 NOTE — Progress Notes (Signed)
[facility] [id number] Progress Note  MAHNOOR MATHISEN [id number] 52 y.o.  01/31/2013 1:55 PM  Chief Complaint: Increasing anxiety  History of Present Illness: Gabriella Kerr presents today to followup on her treatment for ADHD. At her last appointment we prescribed a short acting Adderall because she fell the Vyvanse was wearing off too early in the day. She now reports that "that's not what I needed." She states that she has been "looking within," and feels that she is experiencing a significant amount of anxiety. She reports that she is on medication to decrease her prolactin, and side effects from that cause head pain and symptoms of obsessive compulsive disorder. She finds that at times she cannot leave the house until things have been put away properly, and this causes her to be late to events. She is currently prescribed Cymbalta 60 mg daily for head pain, as prescribed by her pain management doctor. She denies any depression, suicidal or homicidal thoughts, or auditory or visual hallucinations.  Suicidal Ideation: No Plan Formed: No Patient has means to carry out plan: No  Homicidal Ideation: No Plan Formed: No Patient has means to carry out plan: No  Review of Systems: Psychiatric: Agitation: No Hallucination: No Depressed Mood: No Insomnia: No Hypersomnia: No Altered Concentration: No Feels Worthless: No Grandiose Ideas: No Belief In Special Powers: No New/Increased Substance Abuse: No Compulsions: Yes  Neurologic: Headache: No Seizure: No Paresthesias: No  Past Medical History: Migraine headache  Outpatient Encounter Prescriptions as of 01/31/2013  Medication Sig Dispense Refill  . DULoxetine (CYMBALTA) 60 MG capsule Take 2 capsules (120 mg total) by mouth daily.  180 capsule  0  . lisdexamfetamine (VYVANSE) 60 MG capsule Take 1 capsule (60 mg total) by mouth every morning.  90 capsule  0  . Tamsulosin HCl (FLOMAX) 0.4 MG CAPS Take 1 capsule (0.4 mg total) by mouth daily  after supper.  7 capsule  0  . topiramate (TOPAMAX) 200 MG tablet Take 400 mg by mouth daily.        Marland Kitchen zolpidem (AMBIEN) 5 MG tablet Take 5 mg by mouth at bedtime as needed.        . [DISCONTINUED] amphetamine-dextroamphetamine (ADDERALL) 5 MG tablet Take 1 tablet (5 mg total) by mouth daily.  30 tablet  0  . [DISCONTINUED] DULoxetine (CYMBALTA) 60 MG capsule Take 60 mg by mouth daily.        . [DISCONTINUED] lisdexamfetamine (VYVANSE) 60 MG capsule Take 1 capsule (60 mg total) by mouth every morning.  90 capsule  0   No facility-administered encounter medications on file as of 01/31/2013.    Past Psychiatric History/Hospitalization(s): Anxiety: Yes Bipolar Disorder: No Depression: No Mania: No Psychosis: No Schizophrenia: No Personality Disorder: No Hospitalization for psychiatric illness: No History of Electroconvulsive Shock Therapy: No Prior Suicide Attempts: No  Physical Exam: Constitutional:  BP 126/70  Pulse 92  Ht 5\' 7"  (1.702 m)  Wt 125 lb 3.2 oz (56.79 kg)  BMI 19.6 kg/m2  LMP 01/17/2011  General Appearance: alert, oriented, no acute distress, well nourished and well groomed and casually dressed  Musculoskeletal: Strength & Muscle Tone: within normal limits Gait & Station: normal Patient leans: N/A  Psychiatric: Speech (describe rate, volume, coherence, spontaneity, and abnormalities if any): Clear and coherent at a regular rate and rhythm and normal volume  Thought Process (describe rate, content, abstract reasoning, and computation): Within normal limits  Associations: Intact  Thoughts: normal  Mental Status: Orientation: oriented to person, place, time/date and  situation Mood & Affect: anxiety Attention Span & Concentration: Intact  Medical Decision Making (Choose Three): Established Problem, Stable/Improving (1), Review of Psycho-Social Stressors (1), New Problem, with no additional work-up planned (3), Review of Medication Regimen & Side Effects (2)  and Review of New Medication or Change in Dosage (2)  Assessment: Axis I: ADHD, inattentive type ; generalized anxiety disorder  Axis II: Deferred  Axis III: Migraine headache  Axis IV: Mild to moderate  Axis V: 60   Plan: We will continue the Vyvanse at 60 mg daily and discontinue the Adderall. We will increase her Cymbalta to 120 mg daily and have her return for a followup in approximately 6 weeks.  Molly Maselli, PA-C 01/31/2013

## 2013-02-06 ENCOUNTER — Telehealth (HOSPITAL_COMMUNITY): Payer: Self-pay | Admitting: *Deleted

## 2013-02-06 NOTE — Telephone Encounter (Signed)
See contact notes 

## 2013-03-24 ENCOUNTER — Other Ambulatory Visit (HOSPITAL_BASED_OUTPATIENT_CLINIC_OR_DEPARTMENT_OTHER): Payer: Self-pay | Admitting: Obstetrics and Gynecology

## 2013-03-24 DIAGNOSIS — Z1231 Encounter for screening mammogram for malignant neoplasm of breast: Secondary | ICD-10-CM

## 2013-03-29 ENCOUNTER — Ambulatory Visit (HOSPITAL_BASED_OUTPATIENT_CLINIC_OR_DEPARTMENT_OTHER): Payer: Self-pay

## 2013-03-30 ENCOUNTER — Ambulatory Visit (HOSPITAL_COMMUNITY): Payer: Self-pay | Admitting: Psychiatry

## 2013-04-03 ENCOUNTER — Ambulatory Visit (HOSPITAL_COMMUNITY): Payer: Self-pay | Admitting: Physician Assistant

## 2013-04-03 ENCOUNTER — Ambulatory Visit (HOSPITAL_COMMUNITY): Payer: Self-pay | Admitting: Psychiatry

## 2013-04-17 ENCOUNTER — Encounter (HOSPITAL_COMMUNITY): Payer: Self-pay | Admitting: Psychiatry

## 2013-04-17 ENCOUNTER — Encounter (INDEPENDENT_AMBULATORY_CARE_PROVIDER_SITE_OTHER): Payer: Self-pay

## 2013-04-17 ENCOUNTER — Ambulatory Visit (INDEPENDENT_AMBULATORY_CARE_PROVIDER_SITE_OTHER): Payer: 59 | Admitting: Psychiatry

## 2013-04-17 VITALS — BP 118/70 | HR 80 | Wt 129.0 lb

## 2013-04-17 DIAGNOSIS — F411 Generalized anxiety disorder: Secondary | ICD-10-CM

## 2013-04-17 DIAGNOSIS — F988 Other specified behavioral and emotional disorders with onset usually occurring in childhood and adolescence: Secondary | ICD-10-CM

## 2013-04-17 MED ORDER — DULOXETINE HCL 30 MG PO CPEP: 30.0000 mg | ORAL_CAPSULE | Freq: Every day | ORAL | Status: DC

## 2013-04-17 NOTE — Progress Notes (Addendum)
[facility] [id number] Progress Note  Gabriella Kerr [id number] 52 y.o.  04/17/2013 1:59 PM  Chief Complaint:  I am seeing physician assistant in this office since 2008.  I have anxiety and difficulty focusing .    History of Present Illness:  Gabriella Kerr is 52 year old Caucasian self-employed married female who has been seen by a Advice worker in this office since 2008.  Her previous provider left the office.  The patient is taking multiple psychotropic medication.  She has given diagnoses of ADD, anxiety disorder and depression.  Patient endorsed that despite taking Cymbalta 120 mg she continues to have anxiety and nervousness.  She endorsed going to church on Sunday it is difficult because she has a lot of nervousness.  Her Cymbalta was initially started by her rheumatologist for migraine headaches however it was recently increased by Gabriella Kerr in this office building anxiety.  Patient is concerned about the dosage of Cymbalta.  She is also taking Vyvanse to help the attention and focus.  Her attention and focus is better however she does not see any improvement in her anxiety symptoms.  Patient lives with her husband and 72 year old daughter.  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  She also endorsed insomnia and taking Ambien 5 mg as given by her primary care physician.  She is also taking Topamax 400 mg for headaches.  She denies any suicidal thoughts or homicidal thoughts but admitted nervous anxious and chronic pain.  Her primary care physician is Dr. Santiago Kerr .  She is also anxious about upcoming CT scan of her kidney , she was told that she may have kidney stones .  She is taking Flomax .  Patient denies any shakes, tremors, suicidal thoughts or homicidal thoughts.  She denies any paranoia or any hallucination.  She is not drinking or using a little substance.  She is not taking Adderall which was given a few months ago to help her attention and focus.  This  actually made her anxiety worse.  She is also taking medication to decrease her prolactin.  She has pituitary tumor.  She does not remember the medication dosage and name and he felt it she denies any hallucination or any crying spells.  Suicidal Ideation: No Plan Formed: No Patient has means to carry out plan: No  Homicidal Ideation: No Plan Formed: No Patient has means to carry out plan: No  Review of Systems: Psychiatric: Agitation: No Hallucination: No Depressed Mood: No Insomnia: No Hypersomnia: No Altered Concentration: No Feels Worthless: No Grandiose Ideas: No Belief In Special Powers: No New/Increased Substance Abuse: No Compulsions: No  Neurologic: Headache: Yes Seizure: No Paresthesias: No  Past Medical History:  Migraine headache, hypothyroidism, pituitary tumor.  She sees Gabriella Kerr for her headache.  Outpatient Encounter Prescriptions as of 04/17/2013  Medication Sig  . DULoxetine (CYMBALTA) 30 MG capsule Take 1 capsule (30 mg total) by mouth daily.  Marland Kitchen lisdexamfetamine (VYVANSE) 60 MG capsule Take 1 capsule (60 mg total) by mouth every morning.  . [DISCONTINUED] DULoxetine (CYMBALTA) 60 MG capsule Take 2 capsules (120 mg total) by mouth daily.  Marland Kitchen BOTOX 100 UNITS SOLR injection   . clindamycin (CLEOCIN) 150 MG capsule   . fluticasone (FLONASE) 50 MCG/ACT nasal spray   . ibuprofen (ADVIL,MOTRIN) 800 MG tablet   . SYNTHROID 50 MCG tablet   . Tamsulosin HCl (FLOMAX) 0.4 MG CAPS Take 1 capsule (0.4 mg total) by mouth daily after supper.  . topiramate (TOPAMAX)  200 MG tablet Take 400 mg by mouth daily.    Marland Kitchen zolpidem (AMBIEN) 5 MG tablet Take 5 mg by mouth at bedtime as needed.      Past Psychiatric History/Hospitalization(s): Anxiety: Yes Bipolar Disorder: No Depression: No Mania: No Psychosis: No Schizophrenia: No Personality Disorder: No Hospitalization for psychiatric illness: No History of Electroconvulsive Shock Therapy: No Prior Suicide  Attempts: No  Physical Exam: Constitutional:  BP 118/70  Pulse 80  Wt 129 lb (58.514 kg)  LMP 01/17/2011  General Appearance: alert, oriented, no acute distress, well nourished and well groomed and casually dressed  Musculoskeletal: Strength & Muscle Tone: within normal limits Gait & Station: normal Patient leans: N/A  Psychiatric: Speech (describe rate, volume, coherence, spontaneity, and abnormalities if any): Clear and coherent at a regular rate and rhythm and normal volume  Thought Process (describe rate, content, abstract reasoning, and computation): Within normal limits  Associations: Intact  Thoughts: normal  Mental Status: Orientation: oriented to person, place, time/date and situation Mood & Affect: anxiety Attention Span & Concentration: Intact  Medical Decision Making (Choose Three): Established Problem, Stable/Improving (1), Review of Psycho-Social Stressors (1), Decision to obtain old records (1), Review and summation of old records (2), Established Problem, Worsening (2), New Problem, with no additional work-up planned (3), Review of Medication Regimen & Side Effects (2) and Review of New Medication or Change in Dosage (2)  Assessment: Axis I: ADHD, inattentive type ; generalized anxiety disorder  Axis II: Deferred  Axis III: Migraine headache  Axis IV: Mild to moderate  Axis V: 60   Plan:  I had a long discussion with patient about polypharmacy including taking multiple medications for her headache, Ambien, Topamax, Cymbalta and high-dose.  She wants to cut down Cymbalta to 90 mg .  She does not want any medications that cause withdrawal symptoms if she ever decided to stop it.  After some discussion she decided to lower the Cymbalta.  I will continue Vyvanse her present dose.  She is getting Ambien and Topamax from her primary care physician.  We will do blood work on her next visit.  Followup in 4 weeks.  She has 60 mg Cymbalta and we will provide 30 mg  Cymbalta for 3 days.  Recommend because [place] and a question of any concern.  Followup in 4 weeks. Time spent 25 minutes.  More than 50% of the time spent in psychoeducation, counseling and coordination of care.  Discuss safety plan that anytime having active suicidal thoughts or homicidal thoughts then patient need to call 911 or go to the local emergency room.  Cherokee Clowers T., MD 04/17/2013

## 2013-05-17 ENCOUNTER — Encounter (HOSPITAL_COMMUNITY): Payer: Self-pay | Admitting: Psychiatry

## 2013-05-17 ENCOUNTER — Ambulatory Visit (INDEPENDENT_AMBULATORY_CARE_PROVIDER_SITE_OTHER): Payer: 59 | Admitting: Psychiatry

## 2013-05-17 VITALS — BP 123/92 | HR 98 | Ht 67.0 in | Wt 128.6 lb

## 2013-05-17 DIAGNOSIS — Z79899 Other long term (current) drug therapy: Secondary | ICD-10-CM

## 2013-05-17 DIAGNOSIS — F909 Attention-deficit hyperactivity disorder, unspecified type: Secondary | ICD-10-CM

## 2013-05-17 DIAGNOSIS — F988 Other specified behavioral and emotional disorders with onset usually occurring in childhood and adolescence: Secondary | ICD-10-CM

## 2013-05-17 DIAGNOSIS — F411 Generalized anxiety disorder: Secondary | ICD-10-CM

## 2013-05-17 MED ORDER — LISDEXAMFETAMINE DIMESYLATE 40 MG PO CAPS: 40.0000 mg | ORAL_CAPSULE | ORAL | Status: DC

## 2013-05-17 MED ORDER — DULOXETINE HCL 30 MG PO CPEP: ORAL_CAPSULE | ORAL | Status: DC

## 2013-05-17 NOTE — Progress Notes (Addendum)
[facility] [id number] Progress Note  Gabriella Kerr [id number] 52 y.o.  05/17/2013 11:08 AM  Chief Complaint:  Medication management and followup.    History of Present Illness:  Heily came for her followup appointment.  On her last visit I have decreased Cymbalta 90 mg daily at she was taking 120 mg Cymbalta .  Patient denies any withdrawal symptoms.  She continues to have anxiety and nervousness but denies any feeling of hopelessness or helplessness.  She continues to have headaches and taking Topamax 400 mg at bedtime.  When I asked that she is taking a high dose patient applied that her neurologist Dr. Neale Burly told that Topamax can go up to 250 mg a day .  She was calm and cooperative.  She is willing to reduce her stimulant because she realized that she does not need 60 mg of Vyvanse .  She is sleeping better.  She is relieved that her CT scan shows a small stone in the kidney.  Her attention and concentration is good.  She denies any agitation, anger or any mood swing.  She is not drinking or using any illegal substances.  She also brought the current and accurate list of her medication.  She is taking Cabergaline 0.5 mg for her pituitary tumor.  Patient is willing to reduce her Vyvanse.  About appetite and weight is unchanged from the past.  She denies any chest pain, insomnia or any other side effects of medication.  Suicidal Ideation: No Plan Formed: No Patient has means to carry out plan: No  Homicidal Ideation: No Plan Formed: No Patient has means to carry out plan: No  Review of Systems: Psychiatric: Agitation: No Hallucination: No Depressed Mood: No Insomnia: No Hypersomnia: No Altered Concentration: No Feels Worthless: No Grandiose Ideas: No Belief In Special Powers: No New/Increased Substance Abuse: No Compulsions: No  Neurologic: Headache: Yes Seizure: No Paresthesias: No  Past Medical History:  Migraine headache, hypothyroidism, pituitary tumor.  She  sees Santiago Glad for her headache.  Her primary care physician is Dr. Winona Legato.  Outpatient Encounter Prescriptions as of 05/17/2013  Medication Sig  . BOTOX 100 UNITS SOLR injection   . cabergoline (DOSTINEX) 0.5 MG tablet Take 0.25 mg by mouth 2 (two) times a week.  . DULoxetine (CYMBALTA) 30 MG capsule Take 3 capsule daily  . ibuprofen (ADVIL,MOTRIN) 800 MG tablet   . SYNTHROID 50 MCG tablet   . topiramate (TOPAMAX) 200 MG tablet Take 400 mg by mouth daily.    Marland Kitchen zolpidem (AMBIEN) 5 MG tablet Take 5 mg by mouth at bedtime as needed.    . [DISCONTINUED] DULoxetine (CYMBALTA) 30 MG capsule Take 1 capsule (30 mg total) by mouth daily.  Marland Kitchen lisdexamfetamine (VYVANSE) 40 MG capsule Take 1 capsule (40 mg total) by mouth every morning.  . [DISCONTINUED] clindamycin (CLEOCIN) 150 MG capsule   . [DISCONTINUED] fluticasone (FLONASE) 50 MCG/ACT nasal spray   . [DISCONTINUED] lisdexamfetamine (VYVANSE) 60 MG capsule Take 1 capsule (60 mg total) by mouth every morning.  . [DISCONTINUED] Tamsulosin HCl (FLOMAX) 0.4 MG CAPS Take 1 capsule (0.4 mg total) by mouth daily after supper.    Past Psychiatric History/Hospitalization(s): Anxiety: Yes Bipolar Disorder: No Depression: No Mania: No Psychosis: No Schizophrenia: No Personality Disorder: No Hospitalization for psychiatric illness: No History of Electroconvulsive Shock Therapy: No Prior Suicide Attempts: No  Physical Exam: Constitutional:  BP 123/92  Pulse 98  Ht 5\' 7"  (1.702 m)  Wt 128 lb 9.6 oz (58.333 kg)  BMI 20.14 kg/m2  LMP 01/17/2011  General Appearance: alert, oriented, no acute distress, well nourished and well groomed and casually dressed  Musculoskeletal: Strength & Muscle Tone: within normal limits Gait & Station: normal Patient leans: N/A  Psychiatric: Speech (describe rate, volume, coherence, spontaneity, and abnormalities if any): Clear and coherent at a regular rate and rhythm and normal volume  Thought Process  (describe rate, content, abstract reasoning, and computation): Within normal limits  Associations: Intact  Thoughts: normal  Mental Status: Orientation: oriented to person, place, time/date and situation Mood & Affect: anxiety Attention Span & Concentration: Intact  Medical Decision Making (Choose Three): Established Problem, Stable/Improving (1), Review or order clinical lab tests (1), Review of Medication Regimen & Side Effects (2) and Review of New Medication or Change in Dosage (2)  Assessment: Axis I: ADHD, inattentive type ; generalized anxiety disorder  Axis II: Deferred  Axis III: Migraine headache  Axis IV: Mild to moderate  Axis V: 60   Plan:  I will reduce her Vyvanse 40 mg to help relieve some of her anxiety symptoms.  I will continue Cymbalta 90 mg daily.  Patient requires 90 day prescription .  I will order CBC, CMP and hemoglobin A1c which has not done in a while.  Patient is getting Ambien and Topamax from her other providers.  Discuss in detail polypharmacy and higher doses of Topamax.  Recommend to call us back if she has any question or any concern.  We'll consider reducing stimulant if needed on her next appointment.  Followup in 3 months.  Time spent 25 minutes.  More than 50% of the time spent in psychoeducation, counseling and coordination of care.  Discuss safety plan that anytime having active suicidal thoughts or homicidal thoughts then patient need to call 911 or go to the local emergency room.  Cozette Braggs T., MD 05/17/2013

## 2013-08-11 LAB — CBC WITH DIFFERENTIAL/PLATELET
Basophils Absolute: 0 10*3/uL (ref 0.0–0.1)
Basophils Relative: 0 % (ref 0–1)
Eosinophils Absolute: 0.1 10*3/uL (ref 0.0–0.7)
Eosinophils Relative: 2 % (ref 0–5)
HCT: 36.1 % (ref 36.0–46.0)
Hemoglobin: 12 g/dL (ref 12.0–15.0)
Lymphocytes Relative: 33 % (ref 12–46)
Lymphs Abs: 1.5 10*3/uL (ref 0.7–4.0)
MCH: 30.5 pg (ref 26.0–34.0)
MCHC: 33.2 g/dL (ref 30.0–36.0)
MCV: 91.6 fL (ref 78.0–100.0)
Monocytes Absolute: 0.4 10*3/uL (ref 0.1–1.0)
Monocytes Relative: 8 % (ref 3–12)
Neutro Abs: 2.7 10*3/uL (ref 1.7–7.7)
Neutrophils Relative %: 57 % (ref 43–77)
Platelets: 205 10*3/uL (ref 150–400)
RBC: 3.94 MIL/uL (ref 3.87–5.11)
RDW: 13.9 % (ref 11.5–15.5)
WBC: 4.7 10*3/uL (ref 4.0–10.5)

## 2013-08-11 LAB — HEMOGLOBIN A1C
Hgb A1c MFr Bld: 5.4 % (ref <5.7)
Mean Plasma Glucose: 108 mg/dL (ref <117)

## 2013-08-16 ENCOUNTER — Ambulatory Visit (INDEPENDENT_AMBULATORY_CARE_PROVIDER_SITE_OTHER): Payer: 59 | Admitting: Psychiatry

## 2013-08-16 ENCOUNTER — Encounter (HOSPITAL_COMMUNITY): Payer: Self-pay | Admitting: Psychiatry

## 2013-08-16 VITALS — BP 130/79 | HR 85 | Ht 67.52 in | Wt 131.2 lb

## 2013-08-16 DIAGNOSIS — F909 Attention-deficit hyperactivity disorder, unspecified type: Secondary | ICD-10-CM

## 2013-08-16 DIAGNOSIS — F411 Generalized anxiety disorder: Secondary | ICD-10-CM

## 2013-08-16 DIAGNOSIS — F988 Other specified behavioral and emotional disorders with onset usually occurring in childhood and adolescence: Secondary | ICD-10-CM

## 2013-08-16 MED ORDER — LISDEXAMFETAMINE DIMESYLATE 40 MG PO CAPS: 40.0000 mg | ORAL_CAPSULE | ORAL | Status: DC

## 2013-08-16 NOTE — Progress Notes (Signed)
[place] [phone] Progress Note  Gabriella Kerr [id number] 52 y.o.  08/16/2013 11:07 AM  Chief Complaint:  Medication management and followup.    History of Present Illness:  Shahla came for her followup appointment.  She is lot of physical symptoms in recent weeks.  She is complaining of toothache and required a root canal .  However her pain does not improve and she was recently diagnosed with TMJ and she scheduled to see physician at [facility].  Patient continues to have headaches but he has cut down her Topamax to 300 because she was experiencing memory impairment.  She is feeling better since Cymbalta dose has been reduced.  She is also feeling less anxious since Vyvanse is reduced.  She is able to sleep better.  Recently she also complained of heavy menstrual periods and require to have ultrasound this afternoon.  Patient feels concerned about her physical health but overall she is feeling better.  Her daughter and her husband recently moved from [place] to [place] however they are not working and patient is concerned about them.  She sleeping good.  She denies any agitation anger or any mood swings.  She denies any panic attack.  Her attention and focus is good.  She is able to do multitasking.  She denies any recent crying spells.  Her insight he is under control with the help of medication.  She has no tremors or shakes.  She has blood work which shows normal CBC and hemoglobin A1c.  Her appetite is unchanged from the past.  Suicidal Ideation: No Plan Formed: No Patient has means to carry out plan: No  Homicidal Ideation: No Plan Formed: No Patient has means to carry out plan: No  Review of Systems: Psychiatric: Agitation: No Hallucination: No Depressed Mood: No Insomnia: No Hypersomnia: No Altered Concentration: No Feels Worthless: No Grandiose Ideas: No Belief In Special Powers: No New/Increased Substance Abuse: No Compulsions: No  Neurologic: Headache:  Yes Seizure: No Paresthesias: No  Past Medical History:  Migraine headache, hypothyroidism, pituitary tumor.  She sees [name] for her headache.  Her primary care physician is [name].  Outpatient Encounter Prescriptions as of 08/16/2013  Medication Sig  . BOTOX 100 UNITS SOLR injection   . DULoxetine (CYMBALTA) 30 MG capsule Take 3 capsule daily  . SYNTHROID 50 MCG tablet   . topiramate (TOPAMAX) 100 MG tablet Take 100 mg by mouth 3 (three) times daily.  Marland Kitchen zolpidem (AMBIEN) 5 MG tablet Take 5 mg by mouth at bedtime as needed.    . cabergoline (DOSTINEX) 0.5 MG tablet Take 0.25 mg by mouth 2 (two) times a week.  Marland Kitchen ibuprofen (ADVIL,MOTRIN) 800 MG tablet   . lisdexamfetamine (VYVANSE) 40 MG capsule Take 1 capsule (40 mg total) by mouth every morning.  . topiramate (TOPAMAX) 200 MG tablet Take 400 mg by mouth daily.    . [DISCONTINUED] lisdexamfetamine (VYVANSE) 40 MG capsule Take 1 capsule (40 mg total) by mouth every morning.    Past Psychiatric History/Hospitalization(s): Anxiety: Yes Bipolar Disorder: No Depression: No Mania: No Psychosis: No Schizophrenia: No Personality Disorder: No Hospitalization for psychiatric illness: No History of Electroconvulsive Shock Therapy: No Prior Suicide Attempts: No  Physical Exam: Constitutional:  BP 130/79  Pulse 85  Ht 5' 7.52" (1.715 m)  Wt 131 lb 3.2 oz (59.512 kg)  BMI 20.23 kg/m2  LMP 01/17/2011  Recent Results (from the past 2160 hour(s))  HEMOGLOBIN A1C     Status: None  Collection Time    08/10/13  3:42 PM      Result Value Ref Range   Hemoglobin A1C 5.4  <5.7 %   Comment:                                                                            According to the ADA Clinical Practice Recommendations for 2011, when     HbA1c is used as a screening test:             >=6.5%   Diagnostic of Diabetes Mellitus                (if abnormal result is confirmed)           5.7-6.4%   Increased risk of developing  Diabetes Mellitus           References:Diagnosis and Classification of Diabetes Mellitus,Diabetes     DJTT,0177,93(JQZES 1):S62-S69 and Standards of Medical Care in             Diabetes - 2011,Diabetes Care,2011,34 (Suppl 1):S11-S61.         Mean Plasma Glucose 108  <117 mg/dL  CBC WITH DIFFERENTIAL     Status: None   Collection Time    08/10/13  3:42 PM      Result Value Ref Range   WBC 4.7  4.0 - 10.5 K/uL   RBC 3.94  3.87 - 5.11 MIL/uL   Hemoglobin 12.0  12.0 - 15.0 g/dL   HCT 36.1  36.0 - 46.0 %   MCV 91.6  78.0 - 100.0 fL   MCH 30.5  26.0 - 34.0 pg   MCHC 33.2  30.0 - 36.0 g/dL   RDW 13.9  11.5 - 15.5 %   Platelets 205  150 - 400 K/uL   Neutrophils Relative % 57  43 - 77 %   Neutro Abs 2.7  1.7 - 7.7 K/uL   Lymphocytes Relative 33  12 - 46 %   Lymphs Abs 1.5  0.7 - 4.0 K/uL   Monocytes Relative 8  3 - 12 %   Monocytes Absolute 0.4  0.1 - 1.0 K/uL   Eosinophils Relative 2  0 - 5 %   Eosinophils Absolute 0.1  0.0 - 0.7 K/uL   Basophils Relative 0  0 - 1 %   Basophils Absolute 0.0  0.0 - 0.1 K/uL   Smear Review Criteria for review not met      General Appearance: alert, oriented, no acute distress, well nourished and well groomed and casually dressed  Musculoskeletal: Strength & Muscle Tone: within normal limits Gait & Station: normal Patient leans: N/A  Mental status examination Patient is well groomed well dressed female who appears to be her stated age.  Her speech is clear and coherent.  Her thought process is slow but logical and goal-directed.  She denies any auditory or visual hallucination.  She describes her mood as anxious and her affect is mood appropriate.  There were no delusions, paranoia or any obsessive thoughts.  She denies any active or passive suicidal thoughts and homicidal thoughts.  Her fund of knowledge is adequate.  There were no tremors or shakes.  Her attention and concentration is  okay.  She is alert and oriented x3.  Her insight judgment and  impulse control is okay.  Established Problem, Stable/Improving (1), Review or order clinical lab tests (1), Review and summation of old records (2), Review of Last Therapy Session (1), Review of Medication Regimen & Side Effects (2) and Review of New Medication or Change in Dosage (2)  Assessment: Axis I: ADHD, inattentive type ; generalized anxiety disorder  Axis II: Deferred  Axis III: Migraine headache  Axis IV: Mild to moderate  Axis V: 60   Plan:  Patient is doing better mentally despite multiple physical illness and symptoms.  She is scheduled to see a specialist at [facility] for her chronic headache and TMJ.  She also scheduled to have ultrasound because she has heavy menstrual bleeding.  Patient is reluctant to reduce further her Vyvanse at this time.  I will continue her Vyvanse 40 mg and Cymbalta 90 mg daily.  However I recommended if her anxiety does not get better we may need to further cut down her stimulant.  I discussed about results with her.  I also provided copy of results to her.  Recommended to call us back if she has any question or any concern.  Followup in 3 months.  Time spent 25 minutes.  More than 50% of the time spent in psychoeducation, counseling and coordination of care.  Discuss safety plan that anytime having active suicidal thoughts or homicidal thoughts then patient need to call 911 or go to the local emergency room.  Jenasis Straley T., MD 08/16/2013

## 2013-08-18 ENCOUNTER — Other Ambulatory Visit (HOSPITAL_COMMUNITY): Payer: Self-pay | Admitting: Psychiatry

## 2013-08-18 MED ORDER — DULOXETINE HCL 30 MG PO CPEP: ORAL_CAPSULE | ORAL | Status: DC

## 2013-08-30 ENCOUNTER — Encounter (HOSPITAL_COMMUNITY): Payer: Self-pay | Admitting: *Deleted

## 2013-08-30 NOTE — Progress Notes (Signed)
Prior Authorization for Vyvanse 40 mg obtained from [facility] PA# [id number] effective until 08/30/16 Pharmacy notified by fax Patient notified by phone-left message

## 2013-09-11 ENCOUNTER — Ambulatory Visit: Payer: 59 | Attending: Family Medicine | Admitting: Physical Therapy

## 2013-09-11 DIAGNOSIS — M542 Cervicalgia: Secondary | ICD-10-CM | POA: Insufficient documentation

## 2013-09-11 DIAGNOSIS — IMO0001 Reserved for inherently not codable concepts without codable children: Secondary | ICD-10-CM | POA: Insufficient documentation

## 2013-09-11 DIAGNOSIS — M25519 Pain in unspecified shoulder: Secondary | ICD-10-CM | POA: Insufficient documentation

## 2013-09-18 ENCOUNTER — Ambulatory Visit: Payer: 59 | Admitting: Physical Therapy

## 2013-11-16 ENCOUNTER — Ambulatory Visit (HOSPITAL_COMMUNITY): Payer: Self-pay | Admitting: Psychiatry

## 2013-11-21 ENCOUNTER — Other Ambulatory Visit (HOSPITAL_COMMUNITY): Payer: Self-pay | Admitting: Psychiatry

## 2013-11-22 ENCOUNTER — Other Ambulatory Visit (HOSPITAL_COMMUNITY): Payer: Self-pay | Admitting: Psychiatry

## 2013-11-30 ENCOUNTER — Encounter (HOSPITAL_COMMUNITY): Payer: Self-pay | Admitting: Psychiatry

## 2013-11-30 ENCOUNTER — Ambulatory Visit (INDEPENDENT_AMBULATORY_CARE_PROVIDER_SITE_OTHER): Payer: 59 | Admitting: Psychiatry

## 2013-11-30 VITALS — BP 115/81 | HR 93 | Ht 67.5 in | Wt 133.0 lb

## 2013-11-30 DIAGNOSIS — F988 Other specified behavioral and emotional disorders with onset usually occurring in childhood and adolescence: Secondary | ICD-10-CM

## 2013-11-30 DIAGNOSIS — F411 Generalized anxiety disorder: Secondary | ICD-10-CM

## 2013-11-30 DIAGNOSIS — F909 Attention-deficit hyperactivity disorder, unspecified type: Secondary | ICD-10-CM

## 2013-11-30 MED ORDER — LISDEXAMFETAMINE DIMESYLATE 40 MG PO CAPS: 40.0000 mg | ORAL_CAPSULE | ORAL | Status: DC

## 2013-11-30 NOTE — Progress Notes (Signed)
[place] Progress Note  Gabriella Kerr [id number] y.o.  11/30/2013 4:50 PM  Chief Complaint:  Medication management and followup.    History of Present Illness:  Gabriella Kerr came for her followup appointment.  She continued to endorse a lot of physical symptoms.  Recently she seen by her OB/GYN and she was told that she is going through menopause.  However she is reluctant to take estrogen replacement therapy because of pituitary tumor.  She continues to take Topamax for migraine headaches.  She is also taking soma which is prescribed by her neurologist.  She sleeping good with Ambien which is given by neurologist.  She endorsed lack of energy .  She is not sure if the physical symptoms are the reasons.  She is compliant with Cymbalta 90 mg daily at patient denies any agitation, anger, mood swing.  She scheduled to see Dr. Chalmers Kerr for his endocrinologist and she was discussed about low-dose testosterone for increased energy and decreased libido.  Patient denies any chest pain.  Her vitals are stable.  She denies any panic attack or any irritability.  Her attention and focus is better.  She denies any feeling of hopelessness or worthlessness.  She was to continue her current psychotropic medication.  Suicidal Ideation: No Plan Formed: No Patient has means to carry out plan: No  Homicidal Ideation: No Plan Formed: No Patient has means to carry out plan: No  Review of Systems: Psychiatric: Agitation: No Hallucination: No Depressed Mood: No Insomnia: No Hypersomnia: No Altered Concentration: No Feels Worthless: No Grandiose Ideas: No Belief In Special Powers: No New/Increased Substance Abuse: No Compulsions: No  Neurologic: Headache: Yes Seizure: No Paresthesias: No  Past Medical History:  Migraine headache, hypothyroidism, pituitary tumor.  She sees Gabriella Kerr for her headache.  Her primary care physician is Dr. Laurie Kerr.  Outpatient Encounter Prescriptions as  of 11/30/2013  Medication Sig  . carisoprodol (SOMA) 350 MG tablet TAKE 1 TABLET BY MOUTH TWICE A DAY AS NEEDED FOR SEVERE MUSCLE SPASM  . BOTOX 100 UNITS SOLR injection   . cabergoline (DOSTINEX) 0.5 MG tablet Take 0.25 mg by mouth 2 (two) times a week.  . DULoxetine (CYMBALTA) 30 MG capsule TAKE 3 CAPSULE DAILY  . ibuprofen (ADVIL,MOTRIN) 800 MG tablet   . lisdexamfetamine (VYVANSE) 40 MG capsule Take 1 capsule (40 mg total) by mouth every morning.  Marland Kitchen SYNTHROID 50 MCG tablet   . topiramate (TOPAMAX) 100 MG tablet Take 100 mg by mouth 3 (three) times daily.  Marland Kitchen zolpidem (AMBIEN) 5 MG tablet Take 5 mg by mouth at bedtime as needed.    . [DISCONTINUED] lisdexamfetamine (VYVANSE) 40 MG capsule Take 1 capsule (40 mg total) by mouth every morning.  . [DISCONTINUED] topiramate (TOPAMAX) 200 MG tablet Take 400 mg by mouth daily.      Past Psychiatric History/Hospitalization(s): Anxiety: Yes Bipolar Disorder: No Depression: No Mania: No Psychosis: No Schizophrenia: No Personality Disorder: No Hospitalization for psychiatric illness: No History of Electroconvulsive Shock Therapy: No Prior Suicide Attempts: No  Physical Exam: Constitutional:  BP 115/81  Pulse 93  Ht 5' 7.5" (1.715 m)  Wt 133 lb (60.328 kg)  BMI 20.51 kg/m2  LMP 01/17/2011  No results found for this or any previous visit (from the past 2160 hour(s)).  General Appearance: alert, oriented, no acute distress, well nourished and well groomed and casually dressed  Musculoskeletal: Strength & Muscle Tone: within normal limits Gait & Station: normal Patient leans: N/A  Mental status  examination Patient is well groomed well dressed female who appears to be her stated age.  Her speech is clear and coherent.  Her thought process is slow but logical and goal-directed.  She denies any auditory or visual hallucination.  She describes her mood as anxious and her affect is mood appropriate.  There were no delusions, paranoia or any  obsessive thoughts.  She denies any active or passive suicidal thoughts and homicidal thoughts.  Her fund of knowledge is adequate.  There were no tremors or shakes.  Her attention and concentration is okay.  She is alert and oriented x3.  Her insight judgment and impulse control is okay.  Established Problem, Stable/Improving (1), Review of Last Therapy Session (1) and Review of Medication Regimen & Side Effects (2)  Assessment: Axis I: ADHD, inattentive type ; generalized anxiety disorder  Axis II: Deferred  Axis III: Migraine headache  Axis IV: Mild to moderate  Axis V: 60   Plan:  [place] continue her current psychotropic medication which is Vyvanse 40 mg Cymbalta 90 mg.  She takes Ambien, Topamax, soma from her neurologist.  Patient will discuss with her endocrinologist about low-dose testosterone for decreased libido .  I recommended to call us back if she has any question or any concern.  Followup in 3 months.  Gabriella Kerr T., MD 11/30/2013

## 2013-12-18 ENCOUNTER — Ambulatory Visit (HOSPITAL_BASED_OUTPATIENT_CLINIC_OR_DEPARTMENT_OTHER)
Admission: RE | Admit: 2013-12-18 | Discharge: 2013-12-18 | Disposition: A | Discharge status: Home / Self Care | Payer: 59 | Source: Ambulatory Visit | Attending: Obstetrics and Gynecology | Admitting: Obstetrics and Gynecology

## 2013-12-18 DIAGNOSIS — Z1231 Encounter for screening mammogram for malignant neoplasm of breast: Secondary | ICD-10-CM

## 2014-03-02 ENCOUNTER — Ambulatory Visit (INDEPENDENT_AMBULATORY_CARE_PROVIDER_SITE_OTHER): Payer: 59 | Admitting: Psychiatry

## 2014-03-02 ENCOUNTER — Encounter (HOSPITAL_COMMUNITY): Payer: Self-pay | Admitting: Psychiatry

## 2014-03-02 VITALS — BP 109/66 | HR 78 | Wt 137.2 lb

## 2014-03-02 DIAGNOSIS — F900 Attention-deficit hyperactivity disorder, predominantly inattentive type: Secondary | ICD-10-CM

## 2014-03-02 DIAGNOSIS — F411 Generalized anxiety disorder: Secondary | ICD-10-CM

## 2014-03-02 DIAGNOSIS — F909 Attention-deficit hyperactivity disorder, unspecified type: Secondary | ICD-10-CM

## 2014-03-02 MED ORDER — LISDEXAMFETAMINE DIMESYLATE 40 MG PO CAPS: 40.0000 mg | ORAL_CAPSULE | ORAL | Status: DC

## 2014-03-02 NOTE — Progress Notes (Signed)
[place] Progress Note  Gabriella Kerr [id number] 53 y.o.  03/02/2014 11:49 AM  Chief Complaint:  I am feeling tired.  I have no energy.      History of Present Illness:  Gabriella Kerr came for her followup appointment.  She is complaining of increase in fatigue and lack of energy.  She is seeing her endocrinologist Dr. Chalmers Cater and recently discovered that she has hypothyroidism .  She is taking thyroid however she is not happy with her recovery.  She is also not happy because she gained weight from the past.  She told her prolactin level was high because she stopped taking carbergeline.  Patient has a tissue to a tumor.  Her primary care physician started her on Neurontin and she is taking 300 mg 3 times a day for neck pain.  She told that she is taking 100 mg 3 times a day however it is prescribed 300 mg 3 times a day.  Patient was not aware about the dose that she was taking.  However patient do not report any side effects.  She feels her chronic pain is improving.  She is not taking soma but she still require Ambien at night.  She is wondering if Vyvanse can be increased.  Patient admitted sometime having difficulty multitasking.  She denies any feeling of hopelessness or worthlessness.  She denies any chest pain or any insomnia.  She has no tremors or shakes.  She discussed with her endocrinologist to try low-dose testosterone to increased libido however her endocrinologist did not recommend because of the side effects.  Patient denied any paranoia or any hallucination.  She is scheduled to see her endocrinologist in a few weeks.  She will get another TSH and hoping that her thyroid medicine would increase.  Her vitals are stable however she has gained weight from the past.  She denies any insomnia or change in her appetite.  Suicidal Ideation: No Plan Formed: No Patient has means to carry out plan: No  Homicidal Ideation: No Plan Formed: No Patient has means to carry out plan:  No  Review of Systems  Constitutional: Positive for malaise/fatigue. Negative for weight loss.  Cardiovascular: Negative.   Musculoskeletal: Positive for neck pain.  Skin: Negative.   Neurological: Positive for weakness.  Psychiatric/Behavioral: The patient is nervous/anxious.    Psychiatric: Agitation: No Hallucination: No Depressed Mood: No Insomnia: No Hypersomnia: Yes Altered Concentration: No Feels Worthless: No Grandiose Ideas: No Belief In Special Powers: No New/Increased Substance Abuse: No Compulsions: No  Neurologic: Headache: Yes Seizure: No Paresthesias: No  Past Medical History:  Migraine headache, hypothyroidism, pituitary tumor.  She sees [name] for her headache.  Her primary care physician is [name].  Outpatient Encounter Prescriptions as of 03/02/2014  Medication Sig  . BOTOX 100 UNITS SOLR injection   . cabergoline (DOSTINEX) 0.5 MG tablet Take 0.25 mg by mouth 2 (two) times a week.  . DULoxetine (CYMBALTA) 30 MG capsule TAKE 3 CAPSULE DAILY  . gabapentin (NEURONTIN) 300 MG capsule Take 300 mg by mouth 3 (three) times daily.  Marland Kitchen ibuprofen (ADVIL,MOTRIN) 800 MG tablet   . lisdexamfetamine (VYVANSE) 40 MG capsule Take 1 capsule (40 mg total) by mouth every morning.  Marland Kitchen SYNTHROID 50 MCG tablet   . topiramate (TOPAMAX) 100 MG tablet Take 100 mg by mouth 3 (three) times daily.  Marland Kitchen zolpidem (AMBIEN) 5 MG tablet Take 5 mg by mouth at bedtime as needed.    . [DISCONTINUED] carisoprodol (  SOMA) 350 MG tablet TAKE 1 TABLET BY MOUTH TWICE A DAY AS NEEDED FOR SEVERE MUSCLE SPASM  . [DISCONTINUED] lisdexamfetamine (VYVANSE) 40 MG capsule Take 1 capsule (40 mg total) by mouth every morning.    Past Psychiatric History/Hospitalization(s): Anxiety: Yes Bipolar Disorder: No Depression: No Mania: No Psychosis: No Schizophrenia: No Personality Disorder: No Hospitalization for psychiatric illness: No History of Electroconvulsive Shock Therapy: No Prior  Suicide Attempts: No  Physical Exam: Constitutional:  BP 109/66  Pulse 78  Wt 137 lb 3.2 oz (62.234 kg)  LMP 01/17/2011  No results found for this or any previous visit (from the past 2160 hour(s)).  General Appearance: alert, oriented, no acute distress, well nourished and well groomed and casually dressed  Musculoskeletal: Strength & Muscle Tone: within normal limits Gait & Station: normal Patient leans: N/A  Mental status examination Patient is well groomed well dressed female who appears to be her stated age.  She appears tired and she described her mood neutral.  Her affect is okay.  Her speech is clear and coherent.  Her thought process is slow but logical and goal-directed.  She denies any auditory or visual hallucination.  There were no delusions, paranoia or any obsessive thoughts.  She denies any active or passive suicidal thoughts and homicidal thoughts.  Her fund of knowledge is adequate.  There were no tremors or shakes.  Her attention and concentration is okay.  She is alert and oriented x3.  Her insight judgment and impulse control is okay.  Established Problem, Stable/Improving (1), Review of Psycho-Social Stressors (1), Decision to obtain old records (1), Review of Last Therapy Session (1) and Review of Medication Regimen & Side Effects (2)  Assessment: Axis I: ADHD, inattentive type ; generalized anxiety disorder  Axis II: Deferred  Axis III: Migraine headache  Axis IV: Mild to moderate  Axis V: 60   Plan:  Reassurance given.  Discuss that hypothyroidism can cause worsening of depression , fatigue , lack of energy and tiredness .  Recommended to stay on her current dose of stimulant since she just recently started medication for hypothyroidism.  I also discussed that Neurontin 300 mg 3 times a day and also makes her very tired.  We will get collateral information from her endocrinologist Dr. Chalmers Cater.  At this time I will continue Vyvanse 40 mg and Cymbalta 90 mg.   she is taking Ambien, Topamax, Neurontin from other providers.  Recommended to call us back if she has any question or any concern or if she feels worsening in her symptoms.Time spent 25 minutes.  More than 50% of the time spent in psychoeducation, counseling and coordination of care.  Discuss safety plan that anytime having active suicidal thoughts or homicidal thoughts then patient need to call 911 or go to the local emergency room. Followup in 3 months.  Pierson Vantol T., MD 03/02/2014

## 2014-04-08 ENCOUNTER — Other Ambulatory Visit (HOSPITAL_COMMUNITY): Payer: Self-pay | Admitting: Psychiatry

## 2014-04-10 ENCOUNTER — Encounter: Payer: Self-pay | Admitting: Internal Medicine

## 2014-05-10 ENCOUNTER — Ambulatory Visit (INDEPENDENT_AMBULATORY_CARE_PROVIDER_SITE_OTHER): Payer: 59 | Admitting: Psychiatry

## 2014-05-10 ENCOUNTER — Encounter (HOSPITAL_COMMUNITY): Payer: Self-pay | Admitting: Psychiatry

## 2014-05-10 VITALS — BP 118/71 | HR 95 | Ht 67.0 in | Wt 138.4 lb

## 2014-05-10 DIAGNOSIS — F900 Attention-deficit hyperactivity disorder, predominantly inattentive type: Secondary | ICD-10-CM

## 2014-05-10 DIAGNOSIS — F411 Generalized anxiety disorder: Secondary | ICD-10-CM

## 2014-05-10 DIAGNOSIS — F909 Attention-deficit hyperactivity disorder, unspecified type: Secondary | ICD-10-CM

## 2014-05-10 MED ORDER — LISDEXAMFETAMINE DIMESYLATE 40 MG PO CAPS: 40.0000 mg | ORAL_CAPSULE | ORAL | Status: DC

## 2014-05-10 NOTE — Progress Notes (Signed)
Gabriella Kerr [id number] 53 y.o.  05/10/2014 4:38 PM  Chief Complaint:  I am feeling more anxious and nervous.      History of Present Illness:  Gabriella Kerr came earlier than her scheduled appointment.  She is complaining of increased anxiety and nervousness.  She find out that her husband may lose his job next month.  She reported her husband is very sad depressed and she feels ready anxious when she see him.  Sometimes she feels hopeless but denies any active or passive suicidal thoughts or homicidal thought.  Recently she see [name] for thyroid checkup and everything was okay.  She is taking Neurontin however despite given 300 mg at bedtime she is only taking 100 mg at bedtime.  She admitted poor sleep.  In the beginning she was given 900 mg but she feels very tired .  Patient is taking multiple pain medication and recently soma added to her regimen.  She is taking Vyvanse which is helping her multitasking.  Her attention and focus is good.  She was not sleeping and restart taking Ambien.  Patient denies any hallucination, paranoia, crying spells or any agitation.  Her appetite is okay.  Her vitals are stable.  Suicidal Ideation: No Plan Formed: No Patient has means to carry out plan: No  Homicidal Ideation: No Plan Formed: No Patient has means to carry out plan: No  Review of Systems  Constitutional: Negative for weight loss.  Cardiovascular: Negative.   Musculoskeletal: Positive for neck pain.  Skin: Negative.   Psychiatric/Behavioral: The patient is nervous/anxious.        Anxiety   Psychiatric: Agitation: No Hallucination: No Depressed Mood: Yes Insomnia: No Hypersomnia: No Altered Concentration: No Feels Worthless: No Grandiose Ideas: No Belief In Special Powers: No New/Increased Substance Abuse: No Compulsions: No  Neurologic: Headache: Yes Seizure: No Paresthesias: No  Past Medical History:  Migraine headache,  hypothyroidism, pituitary tumor.  She sees [name] for her headache.  Her primary care physician is [name].  Outpatient Encounter Prescriptions as of 05/10/2014  Medication Sig  . gabapentin (NEURONTIN) 100 MG capsule 3 capsules by mouth at hs.  Marland Kitchen BOTOX 100 UNITS SOLR injection   . cabergoline (DOSTINEX) 0.5 MG tablet Take 0.25 mg by mouth 2 (two) times a week.  . carisoprodol (SOMA) 350 MG tablet   . DULoxetine (CYMBALTA) 30 MG capsule TAKE 3 CAPSULES BY MOUTH EVERY DAY  . ibuprofen (ADVIL,MOTRIN) 800 MG tablet   . lisdexamfetamine (VYVANSE) 40 MG capsule Take 1 capsule (40 mg total) by mouth every morning.  Marland Kitchen SYNTHROID 75 MCG tablet   . topiramate (TOPAMAX) 200 MG tablet Take 400 mg by mouth daily.  Marland Kitchen zolpidem (AMBIEN) 5 MG tablet Take 5 mg by mouth at bedtime as needed.    . [DISCONTINUED] gabapentin (NEURONTIN) 300 MG capsule Take 300 mg by mouth 3 (three) times daily.  . [DISCONTINUED] lisdexamfetamine (VYVANSE) 40 MG capsule Take 1 capsule (40 mg total) by mouth every morning.  . [DISCONTINUED] SYNTHROID 50 MCG tablet   . [DISCONTINUED] topiramate (TOPAMAX) 100 MG tablet Take 100 mg by mouth 3 (three) times daily.    Past Psychiatric History/Hospitalization(s): Anxiety: Yes Bipolar Disorder: No Depression: No Mania: No Psychosis: No Schizophrenia: No Personality Disorder: No Hospitalization for psychiatric illness: No History of Electroconvulsive Shock Therapy: No Prior Suicide Attempts: No  Physical Exam: Constitutional:  BP 118/71 mmHg  Pulse 95  Ht 5\' 7"  (1.702 m)  Wt 138 lb 6.4 oz (62.778 kg)  BMI 21.67 kg/m2  LMP 01/17/2011  No results found for this or any previous visit (from the past 2160 hour(s)).  General Appearance: alert, oriented, no acute distress, well nourished and well groomed and casually dressed  Musculoskeletal: Strength & Muscle Tone: within normal limits Gait & Station: normal Patient leans: N/A  Mental status  examination Patient is well groomed well dressed female who appears to be her stated age.  She described her mood anxious and her affect is mood appropriate.  Her speech is clear and coherent.  Her thought process is slow but logical and goal-directed.  She denies any auditory or visual hallucination.  There were no delusions, paranoia or any obsessive thoughts.  She denies any active or passive suicidal thoughts and homicidal thoughts.  Her fund of knowledge is adequate.  There were no tremors or shakes.  Her attention and concentration is okay.  She is alert and oriented x3.  Her insight judgment and impulse control is okay.  Established Problem, Stable/Improving (1), Review of Psycho-Social Stressors (1), Decision to obtain old records (1), Established Problem, Worsening (2), Review of Last Therapy Session (1), Review of Medication Regimen & Side Effects (2) and Review of New Medication or Change in Dosage (2)  Assessment: Axis I: ADHD, inattentive type ; generalized anxiety disorder  Axis II: Deferred  Axis III: Migraine headache  Axis IV: Mild to moderate  Axis V: 60   Plan:  I had a long discussion with the patient.  She is taking multiple pain medication and recently Soma added.  She is not taking Neurontin 300 mg is prescribed.  I suggested to take Neurontin 100 mg in the morning and 2 at bedtime which helped her depression, anxiety and insomnia.  At this time I will defer any other changes.  Continue Vyvanse 40 mg and Cymbalta 90 mg daily.  She is getting Ambien, Topamax and Neurontin from her other providers.  Discussed polypharmacy.  Recommended counseling however patient will let us know about counseling.  Recommended to call us back if she has any question, concern or worsening of the symptoms.  I will see her again in 3 months. Time spent 25 minutes.  More than 50% of the time spent in psychoeducation, counseling and coordination of care.  Discuss safety plan that anytime having  active suicidal thoughts or homicidal thoughts then patient need to call 911 or go to the local emergency room. Followup in 3 months.  Sumi Lye T., MD 05/10/2014

## 2014-05-31 ENCOUNTER — Telehealth (HOSPITAL_COMMUNITY): Payer: Self-pay | Admitting: *Deleted

## 2014-05-31 ENCOUNTER — Ambulatory Visit (AMBULATORY_SURGERY_CENTER): Payer: Self-pay | Admitting: *Deleted

## 2014-05-31 VITALS — Ht 67.0 in | Wt 139.8 lb

## 2014-05-31 DIAGNOSIS — Z1211 Encounter for screening for malignant neoplasm of colon: Secondary | ICD-10-CM

## 2014-05-31 DIAGNOSIS — F909 Attention-deficit hyperactivity disorder, unspecified type: Secondary | ICD-10-CM

## 2014-05-31 MED ORDER — MOVIPREP 100 G PO SOLR: ORAL | Status: DC

## 2014-05-31 NOTE — Telephone Encounter (Signed)
Yes that is fine. We can override it.

## 2014-05-31 NOTE — Telephone Encounter (Signed)
Called pharmacy and spoke with pharmacist about this decision

## 2014-05-31 NOTE — Telephone Encounter (Signed)
Pt of Dr. Adele Schilder. Pt called stating Dr. Adele Schilder wrote her an prescription for Vyvanse 40 mg on 05-10-14 but put on the script do not fill until Jan 2, 16 and she have been out for a day. Per pt, the pharmacy was going to fill medication for her but the script had restrictions on it. Pt would like to know if office could override this restriction. Pt number is [phone].

## 2014-05-31 NOTE — Telephone Encounter (Signed)
Per previous pt phone call, Dr. Darrall Dears agreed to override pt medication. Dr. Doyne Keel spoke with pharmacist and she stated if pt is not frequently refilling her medications then they can go ahead and fill pt medication a few days early for this time. Pharmacist showed understanding and made a note in pt chart. Pt is aware as well and shows understanding.

## 2014-05-31 NOTE — Progress Notes (Signed)
No egg or soy allergy  No anesthesia or intubation problems per pt  No diet medications taken  Registered in EMMI   

## 2014-06-04 ENCOUNTER — Ambulatory Visit (HOSPITAL_COMMUNITY): Payer: Self-pay | Admitting: Psychiatry

## 2014-06-07 ENCOUNTER — Ambulatory Visit (HOSPITAL_COMMUNITY): Payer: Self-pay | Admitting: Psychiatry

## 2014-06-13 ENCOUNTER — Encounter: Payer: Self-pay | Admitting: Internal Medicine

## 2014-06-13 ENCOUNTER — Ambulatory Visit (AMBULATORY_SURGERY_CENTER): Payer: 59 | Admitting: Internal Medicine

## 2014-06-13 VITALS — BP 128/77 | HR 72 | Temp 97.8°F | Resp 17 | Ht 67.0 in | Wt 139.0 lb

## 2014-06-13 DIAGNOSIS — D128 Benign neoplasm of rectum: Secondary | ICD-10-CM

## 2014-06-13 DIAGNOSIS — K621 Rectal polyp: Secondary | ICD-10-CM

## 2014-06-13 DIAGNOSIS — Z1211 Encounter for screening for malignant neoplasm of colon: Secondary | ICD-10-CM

## 2014-06-13 MED ORDER — SODIUM CHLORIDE 0.9 % IV SOLN: 500.0000 mL | INTRAVENOUS | Status: DC

## 2014-06-13 NOTE — Progress Notes (Signed)
Called to room to assist during endoscopic procedure.  Patient ID and intended procedure confirmed with present staff. Received instructions for my participation in the procedure from the performing physician.  

## 2014-06-13 NOTE — Op Note (Signed)
[facility]  [address]. [place], [address]   COLONOSCOPY PROCEDURE REPORT  PATIENT: Gabriella, Kerr  MR#: [id number] BIRTHDATE: September 29, 1960 , 79  yrs. old GENDER: female ENDOSCOPIST: Eustace Quail, MD REFERRED BL:TJQZE Bouska, M.D. PROCEDURE DATE:  06/13/2014 PROCEDURE:   Colonoscopy with snare polypectomy x 1 First Screening Colonoscopy - Avg.  risk and is 50 yrs.  old or older Yes.  Prior Negative Screening - Now for repeat screening. N/A  History of Adenoma - Now for follow-up colonoscopy & has been > or = to 3 yrs.  N/A  Polyps Removed Today? Yes. ASA CLASS:   Class II INDICATIONS:average risk for colorectal cancer. MEDICATIONS: Monitored anesthesia care and Propofol 300 mg IV  DESCRIPTION OF PROCEDURE:   After the risks benefits and alternatives of the procedure were thoroughly explained, informed consent was obtained.  The digital rectal exam revealed no abnormalities of the rectum.   The [id number]  endoscope was introduced through the anus and advanced to the cecum, which was identified by both the appendix and ileocecal valve. No adverse events experienced.   The quality of the prep was adequate (dense seed material in right colon requiring significant irrigation and suctioning to achieve adequate visualization), using MoviPrep  The instrument was then slowly withdrawn as the colon was fully examined.    COLON FINDINGS: A single polyp measuring 4 mm in size was found in the rectum.  A polypectomy was performed with a cold snare.  The resection was complete, the polyp tissue was completely retrieved and sent to histology.   The examination was otherwise normal. Retroflexed views revealed no abnormalities. The time to cecum=8 minutes 28 seconds.  Withdrawal time=17 minutes 49 seconds.  The scope was withdrawn and the procedure completed. COMPLICATIONS: There were no immediate complications.  ENDOSCOPIC IMPRESSION: 1.   Single polyp  measuring 4 mm in size was found in the rectum; polypectomy was performed with a cold snare 2.   The examination was otherwise normal  RECOMMENDATIONS: 1. Repeat colonoscopy in 5 years if polyp adenomatous; otherwise 10 years  eSigned:  Eustace Quail, MD 06/13/2014 12:49 PM   cc: Bernerd Limbo, MD and The Patient

## 2014-06-13 NOTE — Progress Notes (Signed)
Report to PACU, RN, vss, BBS= Clear.  

## 2014-06-13 NOTE — Patient Instructions (Signed)

## 2014-06-13 NOTE — Progress Notes (Signed)
No problems noted in the recovery room. maw 

## 2014-06-14 ENCOUNTER — Telehealth: Payer: Self-pay | Admitting: *Deleted

## 2014-06-14 NOTE — Telephone Encounter (Signed)
  Follow up Call-  Call back number 06/13/2014  Post procedure Call Back phone  # [phone]  spouse to answer  Permission to leave phone message Yes   spoke with husband; pt not available  Patient questions:  Do you have a fever, pain , or abdominal swelling? No. Pain Score  0 *  Have you tolerated food without any problems? Yes.    Have you been able to return to your normal activities? Yes.    Do you have any questions about your discharge instructions: Diet   No. Medications  No. Follow up visit  No.  Do you have questions or concerns about your Care? No.  Actions: * If pain score is 4 or above: No action needed, pain <4.

## 2014-06-19 ENCOUNTER — Encounter: Payer: Self-pay | Admitting: Internal Medicine

## 2014-07-02 ENCOUNTER — Other Ambulatory Visit (HOSPITAL_COMMUNITY): Payer: Self-pay | Admitting: Psychiatry

## 2014-07-02 DIAGNOSIS — F909 Attention-deficit hyperactivity disorder, unspecified type: Secondary | ICD-10-CM

## 2014-07-03 NOTE — Telephone Encounter (Signed)
New 90 day supply refill order for patient's Cymbalta authorized by Dr. Adele Schilder today e-scribd into [facility] in [place], [place].

## 2014-08-09 ENCOUNTER — Ambulatory Visit (INDEPENDENT_AMBULATORY_CARE_PROVIDER_SITE_OTHER): Payer: 59 | Admitting: Psychiatry

## 2014-08-09 ENCOUNTER — Encounter (HOSPITAL_COMMUNITY): Payer: Self-pay | Admitting: Psychiatry

## 2014-08-09 VITALS — BP 128/76 | HR 92 | Ht 67.0 in | Wt 139.0 lb

## 2014-08-09 DIAGNOSIS — F909 Attention-deficit hyperactivity disorder, unspecified type: Secondary | ICD-10-CM

## 2014-08-09 DIAGNOSIS — F411 Generalized anxiety disorder: Secondary | ICD-10-CM

## 2014-08-09 DIAGNOSIS — F900 Attention-deficit hyperactivity disorder, predominantly inattentive type: Secondary | ICD-10-CM

## 2014-08-09 MED ORDER — LISDEXAMFETAMINE DIMESYLATE 40 MG PO CAPS: 40.0000 mg | ORAL_CAPSULE | ORAL | Status: AC

## 2014-08-09 NOTE — Progress Notes (Signed)
[facility] [phone] Progress Note  Gabriella Kerr [id number] 54 y.o.  08/09/2014 1:35 PM  Chief Complaint:  I am feeling better.  My thyroid medicine is adjusted.  I'm thinking to see my primary care physician for psychiatric medication.  I will not have insurance since my husband unable to find a job.        History of Present Illness:  Gabriella Kerr came for her follow-up appointment.  She is feeling better since her thyroid dose is increased by her endocrinologist.  She has more energy and she is less depressed and less tearful.  She is concerned because her husband did not find any job and she is worried about the finances and insurance.  She tried Neurontin 300 mg but she did not like it to cause it was causing her very sedated and drowsy.  She is taking Neurontin 100 mg as needed when she is in a lot of pain.  Overall she reported her mood has been improved and she believe thyroid medicine helping her.  She denies any crying spells, irritability, anger, feeling of hopelessness or worthlessness.  Her attention and concentration is okay.  She is able to do multitasking.  She has no tremors or shakes.  She continues to have headaches and she is taking Topamax and getting Botox injection regularly.  Her appetite is okay.  Her vitals are stable.  She wants to see her primary care physician because of insurance reason.  Currently she has [name] which will ran out very soon and she cannot afford seeing multiple doctors.  She is wondering if we can send her records to her primary care physician.  She was recently seen her primary care physician for sinusitis and taking prednisone.  She felt sweating since taking the prednisone.  She has taken prednisone in the past and aware that it is a side effects which will be resolved when she finished the prednisone.   Suicidal Ideation: No Plan Formed: No Patient has means to carry out plan: No  Homicidal Ideation: No Plan Formed: No Patient has means  to carry out plan: No  Review of Systems  Musculoskeletal: Positive for back pain.  Skin: Negative.   Neurological: Positive for headaches.   Psychiatric: Agitation: No Hallucination: No Depressed Mood: No Insomnia: No Hypersomnia: No Altered Concentration: No Feels Worthless: No Grandiose Ideas: No Belief In Special Powers: No New/Increased Substance Abuse: No Compulsions: No  Neurologic: Headache: Yes Seizure: No Paresthesias: No  Past Medical History:  Migraine headache, hypothyroidism, pituitary tumor.  She sees [name] for her headache.  Her primary care physician is [name].  Outpatient Encounter Prescriptions as of 08/09/2014  Medication Sig  . BOTOX 100 UNITS SOLR injection   . cabergoline (DOSTINEX) 0.5 MG tablet Take 0.25 mg by mouth 2 (two) times a week.  . Calcium Carbonate-Vitamin D (CALCIUM PLUS VITAMIN D PO) Take 1,000 Units by mouth daily.  . DULoxetine (CYMBALTA) 30 MG capsule TAKE 3 CAPSULES BY MOUTH EVERY DAY  . Flaxseed, Linseed, (FLAXSEED OIL PO) Take by mouth daily.  Marland Kitchen ibuprofen (ADVIL,MOTRIN) 800 MG tablet   . Omega-3 Fatty Acids (OMEGA-3 FISH OIL PO) Take by mouth daily.  Marland Kitchen SYNTHROID 75 MCG tablet   . topiramate (TOPAMAX) 200 MG tablet Take 400 mg by mouth daily.  Marland Kitchen zolpidem (AMBIEN) 5 MG tablet Take 5 mg by mouth at bedtime as needed.    . carisoprodol (SOMA) 350 MG tablet   . gabapentin (NEURONTIN) 100 MG  capsule 3 capsules by mouth at hs.  . lisdexamfetamine (VYVANSE) 40 MG capsule Take 1 capsule (40 mg total) by mouth every morning.  . [DISCONTINUED] lisdexamfetamine (VYVANSE) 40 MG capsule Take 1 capsule (40 mg total) by mouth every morning.    Past Psychiatric History/Hospitalization(s): Anxiety: Yes Bipolar Disorder: No Depression: No Mania: No Psychosis: No Schizophrenia: No Personality Disorder: No Hospitalization for psychiatric illness: No History of Electroconvulsive Shock Therapy: No Prior Suicide Attempts:  No  Physical Exam: Constitutional:  BP 128/76 mmHg  Pulse 92  Ht 5\' 7"  (1.702 m)  Wt 139 lb (63.05 kg)  BMI 21.77 kg/m2  No results found for this or any previous visit (from the past 2160 hour(s)).  General Appearance: alert, oriented, no acute distress, well nourished and well groomed and casually dressed  Musculoskeletal: Strength & Muscle Tone: within normal limits Gait & Station: normal Patient leans: N/A  Mental status examination Patient is well groomed well dressed female who appears to be her stated age.  She described her mood anxious and her affect is mood appropriate.  Her speech is clear and coherent.  Her thought process is slow but logical and goal-directed.  She denies any auditory or visual hallucination.  There were no delusions, paranoia or any obsessive thoughts.  She denies any active or passive suicidal thoughts and homicidal thoughts.  Her fund of knowledge is adequate.  There were no tremors or shakes.  Her attention and concentration is okay.  She is alert and oriented x3.  Her insight judgment and impulse control is okay.  Established Problem, Stable/Improving (1), Review of Psycho-Social Stressors (1), Decision to obtain old records (1), Established Problem, Worsening (2), Review of Last Therapy Session (1), Review of Medication Regimen & Side Effects (2) and Review of New Medication or Change in Dosage (2)  Assessment: Axis I: ADHD, inattentive type ; generalized anxiety disorder  Axis II: Deferred  Axis III: Migraine headache  Axis IV: Mild to moderate  Axis V: 60   Plan:  Patient like to get her records sent to her primary care physician Dr. Bernerd Limbo.  She is getting Ambien, Topamax, Neurontin from her primary care physician anyway.  We are providing Vyvanse 40 mg daily and Cymbalta 90 mg daily.  I discussed polypharmacy, medication side effects and benefits.  I r eviewed recent blood work which was done on March 4 with normal CBC.  Reassurance  given.  Recommended counseling but patient is not interested in counseling at this time due to financial reasons.  I recommended have her sign her consent so can release her information to her primary care physician.  Patient does not want to reschedule anymore appointment at this time however I recommended if she can call us if she need any future appointments.  Patient agreed with the plan.  We will not schedule any appointment at this time.  Time spent 25 minutes.  More than 50% of the time spent in psychoeducation, counseling and coordination of care.  Discuss safety plan that anytime having active suicidal thoughts or homicidal thoughts and she need to call 911 or go to the local emergency room.   Marelly Wehrman T., MD 08/09/2014

## 2014-10-28 ENCOUNTER — Other Ambulatory Visit (HOSPITAL_COMMUNITY): Payer: Self-pay | Admitting: Psychiatry

## 2014-10-31 ENCOUNTER — Other Ambulatory Visit (HOSPITAL_COMMUNITY): Payer: Self-pay | Admitting: Psychiatry

## 2014-10-31 NOTE — Telephone Encounter (Signed)
Cymbalta order declined at this time as patient is not scheduled for any follow ups.  States on last visit 08/09/14 she would be seeing her PCP for medications.  Requested patient make an appointment if medications needed by Dr. Adele Schilder.

## 2014-12-24 ENCOUNTER — Other Ambulatory Visit (HOSPITAL_BASED_OUTPATIENT_CLINIC_OR_DEPARTMENT_OTHER): Payer: Self-pay | Admitting: Obstetrics and Gynecology

## 2014-12-24 DIAGNOSIS — Z1231 Encounter for screening mammogram for malignant neoplasm of breast: Secondary | ICD-10-CM

## 2014-12-25 ENCOUNTER — Ambulatory Visit (HOSPITAL_BASED_OUTPATIENT_CLINIC_OR_DEPARTMENT_OTHER)
Admission: RE | Admit: 2014-12-25 | Discharge: 2014-12-25 | Disposition: A | Discharge status: Home / Self Care | Payer: 59 | Source: Ambulatory Visit | Attending: Obstetrics and Gynecology | Admitting: Obstetrics and Gynecology

## 2014-12-25 DIAGNOSIS — Z1231 Encounter for screening mammogram for malignant neoplasm of breast: Secondary | ICD-10-CM | POA: Diagnosis not present

## 2014-12-26 ENCOUNTER — Other Ambulatory Visit (HOSPITAL_COMMUNITY): Payer: Self-pay | Admitting: Psychiatry

## 2014-12-28 ENCOUNTER — Other Ambulatory Visit (HOSPITAL_COMMUNITY): Payer: Self-pay | Admitting: Psychiatry

## 2016-07-08 ENCOUNTER — Other Ambulatory Visit (HOSPITAL_BASED_OUTPATIENT_CLINIC_OR_DEPARTMENT_OTHER): Payer: Self-pay | Admitting: Obstetrics and Gynecology

## 2016-07-08 DIAGNOSIS — Z1231 Encounter for screening mammogram for malignant neoplasm of breast: Secondary | ICD-10-CM

## 2016-07-16 ENCOUNTER — Encounter (HOSPITAL_BASED_OUTPATIENT_CLINIC_OR_DEPARTMENT_OTHER): Payer: Self-pay

## 2016-07-16 ENCOUNTER — Ambulatory Visit (HOSPITAL_BASED_OUTPATIENT_CLINIC_OR_DEPARTMENT_OTHER)
Admission: RE | Admit: 2016-07-16 | Discharge: 2016-07-16 | Disposition: A | Discharge status: Home / Self Care | Payer: Managed Care, Other (non HMO) | Source: Ambulatory Visit | Attending: Obstetrics and Gynecology | Admitting: Obstetrics and Gynecology

## 2016-07-16 DIAGNOSIS — Z1231 Encounter for screening mammogram for malignant neoplasm of breast: Secondary | ICD-10-CM | POA: Insufficient documentation

## 2017-05-26 ENCOUNTER — Other Ambulatory Visit (HOSPITAL_BASED_OUTPATIENT_CLINIC_OR_DEPARTMENT_OTHER): Payer: Self-pay | Admitting: Obstetrics and Gynecology

## 2017-05-26 DIAGNOSIS — E221 Hyperprolactinemia: Secondary | ICD-10-CM

## 2017-05-26 DIAGNOSIS — M8580 Other specified disorders of bone density and structure, unspecified site: Secondary | ICD-10-CM

## 2017-07-19 ENCOUNTER — Encounter (HOSPITAL_BASED_OUTPATIENT_CLINIC_OR_DEPARTMENT_OTHER): Payer: Self-pay | Admitting: Radiology

## 2017-07-19 ENCOUNTER — Ambulatory Visit (HOSPITAL_BASED_OUTPATIENT_CLINIC_OR_DEPARTMENT_OTHER)
Admission: RE | Admit: 2017-07-19 | Discharge: 2017-07-19 | Disposition: A | Discharge status: Home / Self Care | Payer: Managed Care, Other (non HMO) | Source: Ambulatory Visit | Attending: Obstetrics and Gynecology | Admitting: Obstetrics and Gynecology

## 2017-07-19 DIAGNOSIS — M8580 Other specified disorders of bone density and structure, unspecified site: Secondary | ICD-10-CM | POA: Diagnosis present

## 2017-07-19 DIAGNOSIS — M8588 Other specified disorders of bone density and structure, other site: Secondary | ICD-10-CM | POA: Insufficient documentation

## 2017-07-19 DIAGNOSIS — E221 Hyperprolactinemia: Secondary | ICD-10-CM

## 2017-07-19 DIAGNOSIS — M85851 Other specified disorders of bone density and structure, right thigh: Secondary | ICD-10-CM | POA: Diagnosis not present

## 2018-08-11 ENCOUNTER — Other Ambulatory Visit (HOSPITAL_BASED_OUTPATIENT_CLINIC_OR_DEPARTMENT_OTHER): Payer: Self-pay | Admitting: Obstetrics and Gynecology

## 2018-08-11 ENCOUNTER — Other Ambulatory Visit (HOSPITAL_BASED_OUTPATIENT_CLINIC_OR_DEPARTMENT_OTHER): Payer: Self-pay | Admitting: Student

## 2018-08-11 DIAGNOSIS — Z1231 Encounter for screening mammogram for malignant neoplasm of breast: Secondary | ICD-10-CM

## 2018-08-16 ENCOUNTER — Ambulatory Visit (HOSPITAL_BASED_OUTPATIENT_CLINIC_OR_DEPARTMENT_OTHER): Payer: Self-pay

## 2018-10-24 ENCOUNTER — Ambulatory Visit (HOSPITAL_BASED_OUTPATIENT_CLINIC_OR_DEPARTMENT_OTHER): Payer: Managed Care, Other (non HMO)

## 2018-10-25 ENCOUNTER — Other Ambulatory Visit: Payer: Self-pay

## 2018-10-25 ENCOUNTER — Ambulatory Visit (HOSPITAL_BASED_OUTPATIENT_CLINIC_OR_DEPARTMENT_OTHER)
Admission: RE | Admit: 2018-10-25 | Discharge: 2018-10-25 | Disposition: A | Discharge status: Home / Self Care | Payer: Managed Care, Other (non HMO) | Source: Ambulatory Visit | Attending: Obstetrics and Gynecology | Admitting: Obstetrics and Gynecology

## 2018-10-25 DIAGNOSIS — Z1231 Encounter for screening mammogram for malignant neoplasm of breast: Secondary | ICD-10-CM | POA: Insufficient documentation

## 2019-01-27 ENCOUNTER — Other Ambulatory Visit: Payer: Self-pay

## 2019-01-27 DIAGNOSIS — Z20822 Contact with and (suspected) exposure to covid-19: Secondary | ICD-10-CM

## 2019-01-29 LAB — NOVEL CORONAVIRUS, NAA: SARS-CoV-2, NAA: NOT DETECTED

## 2020-11-22 HISTORY — PX: OTHER SURGICAL HISTORY: SHX169

## 2021-02-24 ENCOUNTER — Other Ambulatory Visit (HOSPITAL_BASED_OUTPATIENT_CLINIC_OR_DEPARTMENT_OTHER): Payer: Self-pay | Admitting: Nurse Practitioner

## 2021-02-24 DIAGNOSIS — Z1231 Encounter for screening mammogram for malignant neoplasm of breast: Secondary | ICD-10-CM

## 2021-02-25 ENCOUNTER — Other Ambulatory Visit: Payer: Self-pay

## 2021-02-25 ENCOUNTER — Encounter (HOSPITAL_BASED_OUTPATIENT_CLINIC_OR_DEPARTMENT_OTHER): Payer: Self-pay

## 2021-02-25 ENCOUNTER — Ambulatory Visit (HOSPITAL_BASED_OUTPATIENT_CLINIC_OR_DEPARTMENT_OTHER)
Admission: RE | Admit: 2021-02-25 | Discharge: 2021-02-25 | Disposition: A | Discharge status: Home / Self Care | Payer: Managed Care, Other (non HMO) | Source: Ambulatory Visit | Attending: Nurse Practitioner | Admitting: Nurse Practitioner

## 2021-02-25 DIAGNOSIS — Z1231 Encounter for screening mammogram for malignant neoplasm of breast: Secondary | ICD-10-CM

## 2021-08-28 ENCOUNTER — Other Ambulatory Visit: Payer: Self-pay | Admitting: *Deleted

## 2021-08-28 ENCOUNTER — Encounter: Payer: Self-pay | Admitting: *Deleted
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
## 2021-08-29 ENCOUNTER — Ambulatory Visit: Payer: Managed Care, Other (non HMO) | Admitting: Psychiatry

## 2021-10-21 ENCOUNTER — Encounter: Payer: Self-pay | Admitting: *Deleted

## 2021-10-22 NOTE — Progress Notes (Unsigned)
Referring:  Sarina Ser., MD [address] [place],  [place] [address]  PCP: Bernerd Limbo, MD  Neurology was asked to evaluate Gabriella Kerr, a 61 year old female for a chief complaint of headaches.  Our recommendations of care will be communicated by shared medical record.    CC:  headaches  History provided from self  HPI:  Medical co-morbidities: hypothyroidism, right trigeminal neuralgia s/p microvascular decompression and gamma knife, pituitary adenoma s/p resection, kidney stones, anxiety  The patient presents for evaluation of headaches which began in high school. They have ebbed and flowed over time. She had MVD for trigeminal neuralgia last year which has helped reduce her headaches somewhat. Prior to surgery she had daily headaches, now she is down to 2 headaches per week. Headaches last 12 hours at a time. They are described as right retro-orbital shooting pain with associated photophobia, phonophobia, and nausea.  She has tried and failed multiple rescue medications. Currently she uses Flexeril and Soma as needed which do help reduce her pain but do not resolve it. She takes Cymbalta and does Botox for headache prevention.   Previously followed with [facility]. Her previous doctor left and she presents to establish care.  Headache History: Onset: high school Triggers: allergies Aura: no Location: right retro-orbital, ear Quality/Description: shooting  Associated Symptoms:  Photophobia: yes  Phonophobia: yes  Nausea: yes Worse with activity?: yes Duration of headaches: 12 hours   Headache days per month: 8 Headache free days per month: 22  Current Treatment: Abortive Soma, Flexeril  Preventative Cymbalta 60 mg daily Botox  Prior Therapies                                 Gabapentin Topamax (kidney  stones) Zonisamide Carbamazepine Oxcarbazepine Cymbalta Effexor Elavil Emgality Aimovig Botox  Rescue: Toradol Indomethacin diclofenac Soma Flexeril Imitrex Maxalt Relpax Ubrelvy Nurtec Reyvow Migranal Nasal lidocaine Phenergan Reglan Zofran  Neck PT  LABS: CBC    Component Value Date/Time   WBC 4.7 08/10/2013 1542   RBC 3.94 08/10/2013 1542   HGB 12.0 08/10/2013 1542   HCT 36.1 08/10/2013 1542   PLT 205 08/10/2013 1542   MCV 91.6 08/10/2013 1542   MCH 30.5 08/10/2013 1542   MCHC 33.2 08/10/2013 1542   RDW 13.9 08/10/2013 1542   LYMPHSABS 1.5 08/10/2013 1542   MONOABS 0.4 08/10/2013 1542   EOSABS 0.1 08/10/2013 1542   BASOSABS 0.0 08/10/2013 1542      Latest Ref Rng & Units 01/25/2011   12:20 AM  CMP  Glucose 70 - 99 mg/dL 123    BUN 6 - 23 mg/dL 22    Creatinine 0.50 - 1.10 mg/dL 0.80    Sodium 135 - 145 mEq/L 140    Potassium 3.5 - 5.1 mEq/L 3.7    Chloride 96 - 112 mEq/L 110    CO2 19 - 32 mEq/L 21    Calcium 8.4 - 10.5 mg/dL 8.9       IMAGING:  CTH 11/18/20: unremarkable   Current Outpatient Medications on File Prior to Visit  Medication Sig Dispense Refill   amphetamine-dextroamphetamine (ADDERALL XR) 30 MG 24 hr capsule Take 30 mg by mouth daily as needed.     atorvastatin (LIPITOR) 40 MG tablet Take by mouth.     baclofen (LIORESAL) 10 MG tablet TAKE ONE TABLET BY MOUTH 3 TIMES A DAY AS NEEDED.  BOTOX 100 UNITS SOLR injection      cabergoline (DOSTINEX) 0.5 MG tablet Take 0.25 mg by mouth 2 (two) times a week.     carisoprodol (SOMA) 350 MG tablet   0   cyclobenzaprine (FLEXERIL) 10 MG tablet Take 10 mg by mouth 3 (three) times daily.     DULoxetine (CYMBALTA) 60 MG capsule Take 1 tablet by mouth daily.     FLUoxetine (PROZAC) 20 MG tablet Take by mouth.     lisdexamfetamine (VYVANSE) 40 MG capsule Take 1 capsule (40 mg total) by mouth every morning. 90 capsule 0   SYNTHROID 75 MCG tablet      topiramate (TOPAMAX) 200 MG tablet  Take 400 mg by mouth daily.  0   zolpidem (AMBIEN) 5 MG tablet Take 5 mg by mouth at bedtime as needed.       gabapentin (NEURONTIN) 800 MG tablet Take by mouth. (Patient not taking: Reported on 10/23/2021)     No current facility-administered medications on file prior to visit.     Allergies: Allergies  Allergen Reactions   Ciprofloxacin Hives   Penicillins Swelling   Metaxalone Nausea Only    Severe headache- one time only   Doxycycline Nausea Only    Nausea. Feeling sick Nausea. Feeling sick Nausea. Feeling sick Nausea. Feeling sick     Family History: Migraine or other headaches in the family:  mother and grandmother Aneurysms in a first degree relative:  none Brain tumors in the family:  brother had metastatic melanoma Other neurological illness in the family:   no  Past Medical History: Past Medical History:  Diagnosis Date   Allergy    Chronic kidney disease    kidney stones   Kidney stones    Migraine    Osteopenia    Thyroid disease    hypothryoid   Trigeminal neuralgia     Past Surgical History Past Surgical History:  Procedure Laterality Date   CESAREAN SECTION     x3   CHOLECYSTECTOMY     LITHOTRIPSY     pituary surgery     trigeminal neuralgia surgery  11/22/2020   Dr Salomon Fick    Social History: Social History   Tobacco Use   Smoking status: Never   Smokeless tobacco: Never  Substance Use Topics   Alcohol use: No    Alcohol/week: 0.0 standard drinks   Drug use: No     ROS: Negative for fevers, chills. Positive for headaches. All other systems reviewed and negative unless stated otherwise in HPI.   Physical Exam:   Vital Signs: BP (!) 167/99 Comment: from sinus medication  Pulse (!) 101   Ht '5\' 7"'$  (1.702 m)   Wt 144 lb (65.3 kg)   LMP 05/31/2014   BMI 22.55 kg/m  GENERAL: well appearing,in no acute distress,alert SKIN:  Color, texture, turgor normal. No rashes or lesions HEAD:  Normocephalic/atraumatic. CV:  RRR RESP:  Normal respiratory effort MSK: +tenderness to palpation over right occiput, neck, and shoulders  NEUROLOGICAL: Mental Status: Alert, oriented to person, place and time,Follows commands Cranial Nerves: PERRL, visual fields intact to confrontation, extraocular movements intact, diminished sensation over right V2-3, no facial droop or ptosis, hearing grossly intact, no dysarthria Motor: muscle strength 5/5 both upper and lower extremities Reflexes: 2+ throughout Sensation: intact to light touch all 4 extremities Coordination: Finger-to- nose-finger intact bilaterally Gait: normal-based   IMPRESSION: 61 year old female with a history of hypothyroidism, right trigeminal neuralgia s/p microvascular decompression and gamma knife,  pituitary adenoma s/p resection, kidney stones, anxiety who presents for evaluation of chronic migraines. Headaches have improved with Botox and Cymbalta, though she continues to have ~2 migraines per week. Discussed alternative preventive medication options. She will research these medications and let me know if she would like to try one.  PLAN: -Preventive: Continue Botox, Cymbalta 60 mg daily -Rescue: Continue Soma, Flexeril PRN -next steps: consider propranolol, Qulipta, Vyepti for prevention  I spent a total of 33 minutes chart reviewing and counseling the patient. Headache education was done. Discussed treatment options including preventive and acute medications, and physical therapy. Discussed medication side effects, adverse reactions and drug interactions. Written educational materials and patient instructions outlining all of the above were given.  Follow-up: for Botox   Genia Harold, MD 10/23/2021   10:00 AM

## 2021-10-23 ENCOUNTER — Encounter: Payer: Self-pay | Admitting: Psychiatry

## 2021-10-23 ENCOUNTER — Telehealth: Payer: Self-pay | Admitting: Psychiatry

## 2021-10-23 ENCOUNTER — Ambulatory Visit: Payer: Managed Care, Other (non HMO) | Admitting: Psychiatry

## 2021-10-23 VITALS — BP 167/99 | HR 101 | Ht 67.0 in | Wt 144.0 lb

## 2021-10-23 DIAGNOSIS — G43719 Chronic migraine without aura, intractable, without status migrainosus: Secondary | ICD-10-CM | POA: Diagnosis not present

## 2021-10-23 NOTE — Telephone Encounter (Signed)
Completed [employer] Botox PA form. Placed form in Nurse Pod for MD signature.

## 2021-10-23 NOTE — Patient Instructions (Signed)
Consider propranolol (blood pressure pill), qulipta (gepant pill), Vyepti (CGRP infusion) Start Botox

## 2021-10-28 NOTE — Telephone Encounter (Signed)
Faxed signed PA form with OV notes to [place].

## 2021-12-15 ENCOUNTER — Encounter: Payer: Self-pay | Admitting: Psychiatry

## 2021-12-15 NOTE — Telephone Encounter (Signed)
I called pt lm to inform her what Christella Scheuermann told me, gave her my direct call back number.

## 2021-12-15 NOTE — Telephone Encounter (Signed)
Please advise the pt

## 2021-12-15 NOTE — Telephone Encounter (Signed)
Noted  

## 2021-12-15 NOTE — Telephone Encounter (Signed)
I called [employer] spoke with re [place] she states patient has a PA on file from previous neurologist that does not expires until 01/16/2022. She states that doctor has to release the PA in order to retrieve it. Reference # [id number].

## 2021-12-25 ENCOUNTER — Other Ambulatory Visit (HOSPITAL_BASED_OUTPATIENT_CLINIC_OR_DEPARTMENT_OTHER): Payer: Self-pay | Admitting: Nurse Practitioner

## 2021-12-25 DIAGNOSIS — Z1231 Encounter for screening mammogram for malignant neoplasm of breast: Secondary | ICD-10-CM

## 2022-01-22 ENCOUNTER — Telehealth: Payer: Self-pay | Admitting: *Deleted

## 2022-01-22 NOTE — Telephone Encounter (Signed)
Called [employer] for Botox PA, spoke with [facility] who stated form must be completed, faxed in. She will send form. [facility] is the specialty pharmacy.  Received PA form, placed on MD desk for completion.

## 2022-01-22 NOTE — Telephone Encounter (Signed)
[employer] Botox PA form completed, signed, faxed with office notes to [facility]. Received confirmation.

## 2022-01-26 ENCOUNTER — Telehealth: Payer: Self-pay | Admitting: Psychiatry

## 2022-01-26 MED ORDER — BOTOX 200 UNITS IJ SOLR: 200.0000 [IU] | INTRAMUSCULAR | 4 refills | Status: DC

## 2022-01-26 NOTE — Telephone Encounter (Signed)
LVM and sent mychart msg asking pt to call back to schedule initial botox appointment

## 2022-01-26 NOTE — Telephone Encounter (Signed)
Rx for botox sent to [facility]

## 2022-01-26 NOTE — Addendum Note (Signed)
Addended by: Genia Harold on: 01/26/2022 12:35 PM   Modules accepted: Orders

## 2022-01-26 NOTE — Telephone Encounter (Signed)
Received fax from [place], Botox approved x 1 year, auth # [id number], effective 01/22/22 through 01/21/23.  Specialty pharmacy is [facility].

## 2022-02-03 ENCOUNTER — Encounter: Payer: Self-pay | Admitting: Psychiatry

## 2022-02-03 NOTE — Telephone Encounter (Signed)
Called [facility] to schedule Botox 200 units delivery, spoke with rep who stated Rx needs clarification. Spoke with pharmacy tech, Colan Neptune and gave her diagnosis code and injection sites. She transferred call to Elberta Fortis who clarified address, stated estimated delivery date 02/17/2022. He then stated he's getting error message when he submits information. He stated her prescription has to be restarted, will send it as expedited request, will take 24 hours, then should be able to schedule delivery tomorrow. We should receive call from [place] tomorrow to let us know its ready for delivery.

## 2022-02-05 NOTE — Telephone Encounter (Signed)
Called [facility], spoke with Lanelle Bal stated botox is ready to be scheduled. She called patient to get consent to deliver, spoke with her husband. Lanelle Bal stated she can now schedule delivery. Delivered on 02/11/22. [facility] will send order form with botox that patient can fill out when she is here for Botox, then RN fax back to [place] will not need to be called for shipments in future.

## 2022-02-19 ENCOUNTER — Ambulatory Visit: Payer: Managed Care, Other (non HMO) | Admitting: Psychiatry

## 2022-02-19 VITALS — BP 140/93 | HR 91

## 2022-02-19 DIAGNOSIS — G43019 Migraine without aura, intractable, without status migrainosus: Secondary | ICD-10-CM | POA: Diagnosis not present

## 2022-02-19 MED ORDER — ONABOTULINUMTOXINA 200 UNITS IJ SOLR
155.0000 [IU] | Freq: Once | INTRAMUSCULAR | Status: AC
  Administered 2022-02-19: 155 [IU] via INTRAMUSCULAR

## 2022-02-19 NOTE — Progress Notes (Signed)
[facility]  BOTULINUM TOXIN INJECTION PROCEDURE NOTE  Patient: Burkley Dech  MRN: [id number]  Indication: Chronic migraines   History: 61 year old female with a history of hypothyroidism, right trigeminal neuralgia s/p microvascular decompression and gamma knife, pituitary adenoma s/p resection, kidney stones, anxiety who follows in clinic for chronic migraines.  Last injection: 07/08/21  Technique: Informed consent was obtained and signed. 200 units onabotulinumtoxinA (Lot# [id number]; Expiration:  07/2024) were reconstituted using normal saline, to a concentration of 5 units per 0.88m. 155 units were injected using sterile technique across 31 sites as follows: corrugator 10, procerus 5 units, frontalis 20 units, temporalis 40 units, occipitalis 30 units, cervical paraspinal 20 units, trapezius 30 units. 45 units were wasted. Patient tolerated procedure without complication.  Prior Therapies                                 Gabapentin Topamax (kidney stones) Zonisamide Carbamazepine Oxcarbazepine Cymbalta Effexor Elavil Emgality Aimovig Botox Neck PT   Rescue: Toradol Indomethacin diclofenac Soma Flexeril Imitrex Maxalt Relpax Ubrelvy Nurtec Reyvow Migranal Nasal lidocaine Phenergan Reglan Zofran  Plan: -Prevention: Continue Cymbalta 60 mg daily, Botox every 3 months -Rescue: Continue Soma, Flexeril as needed.  JAnderson MaltaChima 02/19/22 3:12 PM

## 2022-02-19 NOTE — Progress Notes (Signed)
Botox-200units 1 vial Lot: [id number] Expiration: 07/2024 NDC: 4114-6431-42   Bacteriostatic 0.9% Sodium Chloride- 71m total [id number]: 12/31/22 NDC: [address]-53  Dx: [id number]

## 2022-03-02 ENCOUNTER — Inpatient Hospital Stay (HOSPITAL_BASED_OUTPATIENT_CLINIC_OR_DEPARTMENT_OTHER): Admission: RE | Admit: 2022-03-02 | Payer: Managed Care, Other (non HMO) | Source: Ambulatory Visit

## 2022-03-02 ENCOUNTER — Encounter (HOSPITAL_BASED_OUTPATIENT_CLINIC_OR_DEPARTMENT_OTHER): Payer: Self-pay

## 2022-05-14 ENCOUNTER — Ambulatory Visit: Payer: Managed Care, Other (non HMO) | Admitting: Psychiatry

## 2022-05-14 DIAGNOSIS — G43019 Migraine without aura, intractable, without status migrainosus: Secondary | ICD-10-CM

## 2022-05-14 MED ORDER — ONABOTULINUMTOXINA 200 UNITS IJ SOLR
155.0000 [IU] | Freq: Once | INTRAMUSCULAR | Status: AC
  Administered 2022-05-14: 155 [IU] via INTRAMUSCULAR

## 2022-05-14 NOTE — Progress Notes (Signed)
Botox- 200 units x 1 vial Lot: [id number] Expiration: 07/2024 NDC: 1478-2956-21  Bacteriostatic 0.9% Sodium Chloride- 50m total Lot: [id number]: 06/13/22 NDC: [address]-28 Dx: gUX3.244S/P

## 2022-05-14 NOTE — Progress Notes (Signed)
[facility]  BOTULINUM TOXIN INJECTION PROCEDURE NOTE  Patient: Gabriella Kerr MRN: [id number]  Indication: Chronic migraines   History:  61 year old female with a history of hypothyroidism, right trigeminal neuralgia s/p microvascular decompression and gamma knife, pituitary adenoma s/p resection, kidney stones, anxiety who follows in clinic for chronic migraines. Headaches have been well-controlled since her last Botox session.  Last injection: 02/19/22  Technique: Informed consent was obtained and signed. 200 units onabotulinumtoxinA (Lot#  [id number]; Expiration: 07/2024) were reconstituted using normal saline, to a concentration of 5 units per 0.32m. 155 units were injected using sterile technique across 31 sites as follows: corrugator 10, procerus 5 units, frontalis 20 units, temporalis 40 units, occipitalis 30 units, cervical paraspinal 20 units, trapezius 30 units. 45 units were wasted. Patient tolerated procedure without complication.  Prior Therapies                                 Prevention: Gabapentin Topamax (kidney stones) Zonisamide Carbamazepine Oxcarbazepine Cymbalta Effexor Elavil Emgality Aimovig Botox Neck PT   Rescue: Toradol Indomethacin diclofenac Soma Flexeril Imitrex Maxalt Relpax Ubrelvy Nurtec Reyvow Migranal Nasal lidocaine Phenergan Reglan Zofran  Plan: -Prevention: Continue Cymbalta 60 mg daily, Botox every 3 months -Rescue: Continue Soma, Flexeril as needed.  JAnderson MaltaChima 05/14/22 2:17 PM

## 2022-07-21 ENCOUNTER — Telehealth: Payer: Self-pay

## 2022-07-21 ENCOUNTER — Encounter: Payer: Self-pay | Admitting: Psychiatry

## 2022-07-21 MED ORDER — NURTEC 75 MG PO TBDP: 75.0000 mg | ORAL_TABLET | ORAL | 6 refills | Status: DC | PRN

## 2022-07-21 NOTE — Telephone Encounter (Signed)
I sent a prescription for Nurtec to her pharmacy

## 2022-07-21 NOTE — Telephone Encounter (Signed)
Called pt and told her that Dr. Billey Gosling sent her in some Nurtec to her pharmacy. Pt verbalized understanding.

## 2022-07-25 ENCOUNTER — Encounter: Payer: Self-pay | Admitting: Psychiatry

## 2022-07-27 ENCOUNTER — Telehealth: Payer: Self-pay | Admitting: *Deleted

## 2022-07-27 NOTE — Telephone Encounter (Signed)
Pt reached out stating PA needed for Nurtec

## 2022-07-29 ENCOUNTER — Other Ambulatory Visit (HOSPITAL_COMMUNITY): Payer: Self-pay

## 2022-07-29 NOTE — Telephone Encounter (Signed)
Pharmacy Patient Advocate Encounter   Received notification from [place] that prior authorization for Nurtec 75 MG is required/requested. PA submitted on 07/29/2022 to (ins) [employer] via CoverMyMeds Key [id number] Status is pending

## 2022-07-31 ENCOUNTER — Other Ambulatory Visit (HOSPITAL_COMMUNITY): Payer: Self-pay

## 2022-07-31 NOTE — Telephone Encounter (Signed)
Pharmacy Patient Advocate Encounter  Prior Authorization for Nurtec '75MG'$  has been approved by [place] (ins).    PA #  PA Case ID: [id number] Effective dates: 06/29/2022 through 07/29/2023

## 2022-08-03 NOTE — Telephone Encounter (Signed)
Please call pt back to let her know Nurtec prior authorization approved, she should now be able to pick this up from the pharmacy

## 2022-08-05 NOTE — Telephone Encounter (Signed)
Called pt and left a VM message that Nurtec prior authorization approved, she should now be able to pick this up from the pharmacy.

## 2022-08-06 ENCOUNTER — Ambulatory Visit: Payer: Managed Care, Other (non HMO) | Admitting: Psychiatry

## 2022-08-06 DIAGNOSIS — G43019 Migraine without aura, intractable, without status migrainosus: Secondary | ICD-10-CM

## 2022-08-06 MED ORDER — ONABOTULINUMTOXINA 200 UNITS IJ SOLR
155.0000 [IU] | Freq: Once | INTRAMUSCULAR | Status: AC
  Administered 2022-08-06: 155 [IU] via INTRAMUSCULAR

## 2022-08-06 NOTE — Progress Notes (Signed)
[facility]  BOTULINUM TOXIN INJECTION PROCEDURE NOTE  Patient: Gabriella Kerr MRN: [id number]  Indication: Chronic migraines   History:  62 year old female with a history of hypothyroidism, right trigeminal neuralgia s/p microvascular decompression and gamma knife, pituitary adenoma s/p resection, kidney stones, anxiety who follows in clinic for chronic migraines. Headaches have been well-controlled since her last session. However she has started to notice an increase in twinges of facial pain and neck pain. Flexeril and Soma have not been effective for this.  Last injection: 05/14/22  Technique: Informed consent was obtained and signed. 200 units onabotulinumtoxinA (Lot#  [id number]; Expiration: 10/2024) were reconstituted using normal saline, to a concentration of 5 units per 0.80m. 155 units were injected using sterile technique across 31 sites as follows: corrugator 10, procerus 5 units, frontalis 20 units, temporalis 40 units, occipitalis 30 units, cervical paraspinal 20 units, trapezius 30 units. 45 units were wasted. Patient tolerated procedure without complication.  Prior Therapies                                 Prevention: Gabapentin Topamax (kidney stones) Zonisamide Carbamazepine Oxcarbazepine Cymbalta Effexor Elavil Emgality Aimovig Botox Neck PT   Rescue: Toradol Indomethacin diclofenac Soma Flexeril Imitrex Maxalt Relpax Ubrelvy Nurtec Reyvow Migranal Nasal lidocaine Phenergan Reglan Zofran  Plan: -Prevention: Continue Cymbalta 60 mg daily, Botox every 3 months -Rescue: Continue Nurtec, Soma as needed. Take baclofen 5-10 mg TID PRN as needed for breakthrough facial pain -Next steps: consider restarting oxcarbazepine if facial pain worsens  JGenia Harold3/7/24 2:21 PM

## 2022-08-06 NOTE — Progress Notes (Signed)
Botox- 200 units x 1 vial Lot: [id number] Expiration: 10/2024 NDC: [id number]  Bacteriostatic 0.9% Sodium Chloride- 85m total Lot: [id number]: 11/25 NDC: [id number] Dx: [id number]/P  Observed by ERoberts Gaudy

## 2022-09-24 ENCOUNTER — Encounter: Payer: Self-pay | Admitting: Psychiatry

## 2022-09-24 ENCOUNTER — Encounter (INDEPENDENT_AMBULATORY_CARE_PROVIDER_SITE_OTHER): Payer: Managed Care, Other (non HMO) | Admitting: Psychiatry

## 2022-09-24 DIAGNOSIS — G500 Trigeminal neuralgia: Secondary | ICD-10-CM | POA: Insufficient documentation

## 2022-09-24 DIAGNOSIS — G43019 Migraine without aura, intractable, without status migrainosus: Secondary | ICD-10-CM

## 2022-09-24 MED ORDER — QULIPTA 60 MG PO TABS: 60.0000 mg | ORAL_TABLET | Freq: Every day | ORAL | 11 refills | Status: DC

## 2022-09-24 MED ORDER — OXCARBAZEPINE 150 MG PO TABS: 150.0000 mg | ORAL_TABLET | Freq: Two times a day (BID) | ORAL | 11 refills | Status: DC

## 2022-09-24 MED ORDER — CYCLOBENZAPRINE HCL 10 MG PO TABS: 10.0000 mg | ORAL_TABLET | Freq: Three times a day (TID) | ORAL | 6 refills | Status: DC | PRN

## 2022-09-24 NOTE — Telephone Encounter (Signed)
Called patient,   62 year old female with a history of hypothyroidism, right trigeminal neuralgia s/p microvascular decompression June 2022, and gamma knife before that in Nov 2021, pituitary adenoma s/p resection, kidney stones, anxiety, chronic migraines.   Micro vascular decompression surgery did hep her right trigeminal symptoms, was on polypharmacy, was able to wean off most of them, " tried everything, Trileptal, Flexeril, baclofen,"  but even at her best she still require gabapentin  qhs, was able to wean off trileptal.  Since 2023, she has flares up of right trigeminal pain, including right neck pain, headache with some migraine features,  She was given Baclofen, make her feel strange, Flexeril seems to help her some,   She is now on titrating dose of gabapentin  tid, but still has flare up of right trigeminal pain, right neck spasm, right ear, right lower jaw, lower back molar pain, often involving to her right side headache with migraine features, tried Nurtec, which has been helpful  Meds ordered this encounter  Medications   Atogepant (QULIPTA) 60 MG TABS    Sig: Take 1 tablet (60 mg total) by mouth daily.    Dispense:  30 tablet    Refill:  11   OXcarbazepine (TRILEPTAL) 150 MG tablet    Sig: Take 1 tablet (150 mg total) by mouth 2 (two) times daily.    Dispense:  60 tablet    Refill:  11   cyclobenzaprine (FLEXERIL) 10 MG tablet    Sig: Take 1 tablet (10 mg total) by mouth 3 (three) times daily as needed for muscle spasms.    Dispense:  90 tablet    Refill:  6     Keep current dose of gabapentin  Please see the MyChart message reply(ies) for my assessment and plan.    This patient gave consent for this Medical Advice Message and is aware that it may result in a bill to [employer], as well as the possibility of receiving a bill for a co-payment or deductible. They are an established patient, but are not seeking medical advice exclusively about a  problem treated during an in person or video visit in the last seven days. I did not recommend an in person or video visit within seven days of my reply.    I spent a total of 25 minutes cumulative time within 7 days through [employer].  Levert Feinstein, MD

## 2022-10-25 ENCOUNTER — Other Ambulatory Visit (HOSPITAL_COMMUNITY): Payer: Self-pay

## 2022-10-25 ENCOUNTER — Telehealth: Payer: Self-pay | Admitting: Pharmacy Technician

## 2022-10-25 NOTE — Telephone Encounter (Signed)
Patient Advocate Encounter   Received notification that prior authorization for Qulipta 60MG  tablets is required.   PA submitted on 10/25/2022 Key [id number] [employer] Electronic PA Form Status is pending

## 2022-10-26 ENCOUNTER — Other Ambulatory Visit (HOSPITAL_COMMUNITY): Payer: Self-pay

## 2022-10-26 NOTE — Telephone Encounter (Addendum)
Pharmacy Patient Advocate Encounter  Prior Authorization for Qulipta 60MG  tablets has been approved by [place] (ins).    PA # PA Case ID: [id number]  Effective dates: 09/25/2022 through 10/25/2023   Copay is $0 per [facility] test claim.

## 2022-11-04 ENCOUNTER — Encounter: Payer: Self-pay | Admitting: Neurology

## 2022-11-04 ENCOUNTER — Ambulatory Visit: Payer: Managed Care, Other (non HMO) | Admitting: Neurology

## 2022-11-04 VITALS — BP 129/87 | HR 105 | Ht 67.0 in | Wt 142.5 lb

## 2022-11-04 DIAGNOSIS — G43719 Chronic migraine without aura, intractable, without status migrainosus: Secondary | ICD-10-CM

## 2022-11-04 DIAGNOSIS — G43019 Migraine without aura, intractable, without status migrainosus: Secondary | ICD-10-CM

## 2022-11-04 MED ORDER — ONABOTULINUMTOXINA 200 UNITS IJ SOLR
155.0000 [IU] | Freq: Once | INTRAMUSCULAR | Status: AC
  Administered 2022-11-04: 155 [IU] via INTRAMUSCULAR

## 2022-11-04 NOTE — Progress Notes (Signed)
   BOTOX PROCEDURE NOTE FOR MIGRAINE HEADACHE  HISTORY: Gabriella Kerr is here for Botox for chronic migraine headache. Her last injection was in 08/06/22 with Dr. Delena Bali. Had right MVD surgery 2 years ago. Her migraines are blended in with her TN. Since April, back on Flexeril 10 mg up to 3 times daily mostly takes at night. Has noted huge improvement. Twice a week headaches, really doesn't need any acute headache . Nurtec made her feel heart racing, panicked. Not longer daily headache. Never started the [place].   Description of procedure:  The patient was placed in a sitting position. The standard protocol was used for Botox as follows, with 5 units of Botox injected at each site:  -Procerus muscle, midline injection  -Corrugator muscle, bilateral injection  -Frontalis muscle, bilateral injection, with 2 sites each side, medial injection was performed in the upper one third of the frontalis muscle, in the region vertical from the medial inferior edge of the superior orbital rim. The lateral injection was again in the upper one third of the forehead vertically above the lateral limbus of the cornea, 1.5 cm lateral to the medial injection site.  -Temporalis muscle injection, 4 sites, bilaterally. The first injection was 3 cm above the tragus of the ear, second injection site was 1.5 cm to 3 cm up from the first injection site in line with the tragus of the ear. The third injection site was 1.5-3 cm forward between the first 2 injection sites. The fourth injection site was 1.5 cm posterior to the second injection site.  -Occipitalis muscle injection, 3 sites, bilaterally. The first injection was done one half way between the occipital protuberance and the tip of the mastoid process behind the ear. The second injection site was done lateral and superior to the first, 1 fingerbreadth from the first injection. The third injection site was 1 fingerbreadth superiorly and medially from the first injection  site.  -Cervical paraspinal muscle injection, 2 sites, bilateral, the first injection site was 1 cm from the midline of the cervical spine, 3 cm inferior to the lower border of the occipital protuberance. The second injection site was 1.5 cm superiorly and laterally to the first injection site.  -Trapezius muscle injection was performed at 3 sites, bilaterally. The first injection site was in the upper trapezius muscle halfway between the inflection point of the neck, and the acromion. The second injection site was one half way between the acromion and the first injection site. The third injection was done between the first injection site and the inflection point of the neck.   A 200 unit bottle of Botox was used, 155 units were injected, the rest of the Botox was wasted. The patient tolerated the procedure well, there were no complications of the above procedure.  Botox NDC 0981-1914-78 Lot number [id number]  Expiration date 04/2025 SP  She will remain on Flexeril.  She is can hold off starting [place] since she is doing well.

## 2022-11-04 NOTE — Progress Notes (Signed)
Botox- 200 units x 1 vial Lot: [id number] Expiration: 11.2026 NDC: 0023-3921-02  Bacteriostatic 0.9% Sodium Chloride- 4 mL  Lot: [id number] Expiration: 04.2025 NDC: [id number]  Dx: G43.019 S/P Witnessed by Cinda Quest, CMA

## 2022-11-19 ENCOUNTER — Other Ambulatory Visit (HOSPITAL_COMMUNITY): Payer: Self-pay

## 2022-12-31 ENCOUNTER — Telehealth: Payer: Self-pay | Admitting: Psychiatry

## 2022-12-31 NOTE — Telephone Encounter (Signed)
Per 01/22/22 note:   Received fax from [facility], Botox approved x 1 year, auth # [id number], effective 01/22/22 through 01/21/23.  Specialty pharmacy is [facility].

## 2022-12-31 NOTE — Telephone Encounter (Signed)
[facility] Joni Reining) asking if PA has been started  for Botox once current PA expires. Would a call back.

## 2023-01-04 NOTE — Telephone Encounter (Signed)
Completed auth form and placed in nurse pod for MD signature.

## 2023-01-11 NOTE — Telephone Encounter (Signed)
Faxed continuation form to [facility] @ [phone].

## 2023-01-20 NOTE — Telephone Encounter (Signed)
I called [employer] because we haven't received a response about auth. Spoke with rep Leavy Cella, she told me they never received my fax. I verified correct fax # and resubmitted as urgent.

## 2023-01-21 NOTE — Telephone Encounter (Signed)
Received fax of approval from [employer]: auth# [id number] (01/22/23-01/21/24).

## 2023-02-02 ENCOUNTER — Ambulatory Visit: Payer: Managed Care, Other (non HMO) | Admitting: Neurology

## 2023-02-02 ENCOUNTER — Encounter: Payer: Self-pay | Admitting: Neurology

## 2023-02-02 VITALS — BP 156/97 | HR 79 | Ht 67.0 in | Wt 150.5 lb

## 2023-02-02 DIAGNOSIS — G43019 Migraine without aura, intractable, without status migrainosus: Secondary | ICD-10-CM | POA: Diagnosis not present

## 2023-02-02 MED ORDER — ONABOTULINUMTOXINA 200 UNITS IJ SOLR
155.0000 [IU] | Freq: Once | INTRAMUSCULAR | Status: AC
Start: 2023-02-02 — End: 2023-02-02
  Administered 2023-02-02: 155 [IU] via INTRAMUSCULAR

## 2023-02-02 NOTE — Progress Notes (Signed)
Botox- 200 units x 1 vial Lot: [id number] Expiration: 12.2026 NDC: 0023-3921-02  Bacteriostatic 0.9% Sodium Chloride- 4 mL  Lot: [id number] Expiration: 04.01.2025 NDC: 8295621308  Dx: G43.019 S/P Witnessed by Cinda Quest. CMA

## 2023-02-02 NOTE — Progress Notes (Signed)
   BOTOX PROCEDURE NOTE FOR MIGRAINE HEADACHE  HISTORY: Here for Botox injection. Her last was 11/04/22 with me. This has been her best 3 months in years, only used Nurtec once. Mild headache 2-3 times a week, will take Flexeril at night, can take as needed.  At baseline her left eyebrow is lower than right. Did feel like her right eyebrow was higher?  Description of procedure:  The patient was placed in a sitting position. The standard protocol was used for Botox as follows, with 5 units of Botox injected at each site:  -Procerus muscle, midline injection  -Corrugator muscle, bilateral injection  -Frontalis muscle, bilateral injection, with 2 sites each side, medial injection was performed in the upper one third of the frontalis muscle, in the region vertical from the medial inferior edge of the superior orbital rim. The lateral injection was again in the upper one third of the forehead vertically above the lateral limbus of the cornea, 1.5 cm lateral to the medial injection site.  -Temporalis muscle injection, 4 sites, bilaterally. The first injection was 3 cm above the tragus of the ear, second injection site was 1.5 cm to 3 cm up from the first injection site in line with the tragus of the ear. The third injection site was 1.5-3 cm forward between the first 2 injection sites. The fourth injection site was 1.5 cm posterior to the second injection site.  -Occipitalis muscle injection, 3 sites, bilaterally. The first injection was done one half way between the occipital protuberance and the tip of the mastoid process behind the ear. The second injection site was done lateral and superior to the first, 1 fingerbreadth from the first injection. The third injection site was 1 fingerbreadth superiorly and medially from the first injection site.  -Cervical paraspinal muscle injection, 2 sites, bilateral, the first injection site was 1 cm from the midline of the cervical spine, 3 cm inferior to the lower  border of the occipital protuberance. The second injection site was 1.5 cm superiorly and laterally to the first injection site.  -Trapezius muscle injection was performed at 3 sites, bilaterally. The first injection site was in the upper trapezius muscle halfway between the inflection point of the neck, and the acromion. The second injection site was one half way between the acromion and the first injection site. The third injection was done between the first injection site and the inflection point of the neck.  A 200 unit bottle of Botox was used, 155 units were injected, the rest of the Botox was wasted. The patient tolerated the procedure well, there were no complications of the above procedure.  Botox NDC 4098-1191-47 Lot number [id number] Expiration date 05/2025 SP

## 2023-04-13 MED ORDER — BOTOX 200 UNITS IJ SOLR: 200.0000 [IU] | INTRAMUSCULAR | 4 refills | Status: DC

## 2023-04-13 NOTE — Addendum Note (Signed)
Addended by: Eather Colas E on: 04/13/2023 10:51 AM   Modules accepted: Orders

## 2023-04-13 NOTE — Telephone Encounter (Signed)
Botox rx that [facility] has on file is expired, please send a new one. Thank you!

## 2023-04-13 NOTE — Telephone Encounter (Signed)
Refill sent to [facility].

## 2023-04-19 NOTE — Telephone Encounter (Signed)
Botox delivery is pending pt consent.

## 2023-04-20 NOTE — Telephone Encounter (Signed)
Called patient because [facility] is still needing her consent. She states she will give them a call today.

## 2023-04-27 ENCOUNTER — Other Ambulatory Visit: Payer: Self-pay | Admitting: Neurology

## 2023-04-27 ENCOUNTER — Ambulatory Visit: Payer: Managed Care, Other (non HMO) | Admitting: Neurology

## 2023-04-27 VITALS — BP 135/88

## 2023-04-27 DIAGNOSIS — G43019 Migraine without aura, intractable, without status migrainosus: Secondary | ICD-10-CM

## 2023-04-27 DIAGNOSIS — G500 Trigeminal neuralgia: Secondary | ICD-10-CM

## 2023-04-27 DIAGNOSIS — G43719 Chronic migraine without aura, intractable, without status migrainosus: Secondary | ICD-10-CM

## 2023-04-27 MED ORDER — ONABOTULINUMTOXINA 200 UNITS IJ SOLR
155.0000 [IU] | Freq: Once | INTRAMUSCULAR | Status: AC
  Administered 2023-04-27: 155 [IU] via INTRAMUSCULAR

## 2023-04-27 NOTE — Progress Notes (Signed)
Botox- 200 units x 1 vial Lot: [id number] Expiration: 2026/09 NDC: 0023-3921-02  Bacteriostatic 0.9% Sodium Chloride- 4mL  Lot: [id number] Expiration: 08/31/23 NDC: 4401-0272-53  Dx: G43.019 S/P  Witnessed by Dione Plover

## 2023-04-27 NOTE — Progress Notes (Signed)
   BOTOX PROCEDURE NOTE FOR MIGRAINE HEADACHE  HISTORY: Here for Botox. Her last was 02/02/23 with me.  Has had 0 migraine headaches.  Has Nurtec, but has not needed it.  Has been very pleased with Botox.  Description of procedure:  The patient was placed in a sitting position. The standard protocol was used for Botox as follows, with 5 units of Botox injected at each site:   -Procerus muscle, midline injection  -Corrugator muscle, bilateral injection  -Frontalis muscle, bilateral injection, with 2 sites each side, medial injection was performed in the upper one third of the frontalis muscle, in the region vertical from the medial inferior edge of the superior orbital rim. The lateral injection was again in the upper one third of the forehead vertically above the lateral limbus of the cornea, 1.5 cm lateral to the medial injection site.  -Temporalis muscle injection, 4 sites, bilaterally. The first injection was 3 cm above the tragus of the ear, second injection site was 1.5 cm to 3 cm up from the first injection site in line with the tragus of the ear. The third injection site was 1.5-3 cm forward between the first 2 injection sites. The fourth injection site was 1.5 cm posterior to the second injection site.  -Occipitalis muscle injection, 3 sites, bilaterally. The first injection was done one half way between the occipital protuberance and the tip of the mastoid process behind the ear. The second injection site was done lateral and superior to the first, 1 fingerbreadth from the first injection. The third injection site was 1 fingerbreadth superiorly and medially from the first injection site.  -Cervical paraspinal muscle injection, 2 sites, bilateral, the first injection site was 1 cm from the midline of the cervical spine, 3 cm inferior to the lower border of the occipital protuberance. The second injection site was 1.5 cm superiorly and laterally to the first injection site.  -Trapezius  muscle injection was performed at 3 sites, bilaterally. The first injection site was in the upper trapezius muscle halfway between the inflection point of the neck, and the acromion. The second injection site was one half way between the acromion and the first injection site. The third injection was done between the first injection site and the inflection point of the neck.   A 200 unit bottle of Botox was used, 155 units were injected, the rest of the Botox was wasted. The patient tolerated the procedure well, there were no complications of the above procedure.  Botox NDC 1610-9604-54 Lot number [id number] Expiration date 01/2025 SP

## 2023-05-01 IMAGING — MG MM DIGITAL SCREENING BILAT W/ TOMO AND CAD
8 series · 9 of 24 positions shown · non-contrast
Comparison: Previous exam(s).

CLINICAL DATA: Screening.

EXAM:
DIGITAL SCREENING BILATERAL MAMMOGRAM WITH TOMOSYNTHESIS AND CAD
TECHNIQUE: Bilateral screening digital craniocaudal and mediolateral oblique
mammograms were obtained. Bilateral screening digital breast
tomosynthesis was performed. The images were evaluated with
computer-aided detection.

[L CC synth-2D]
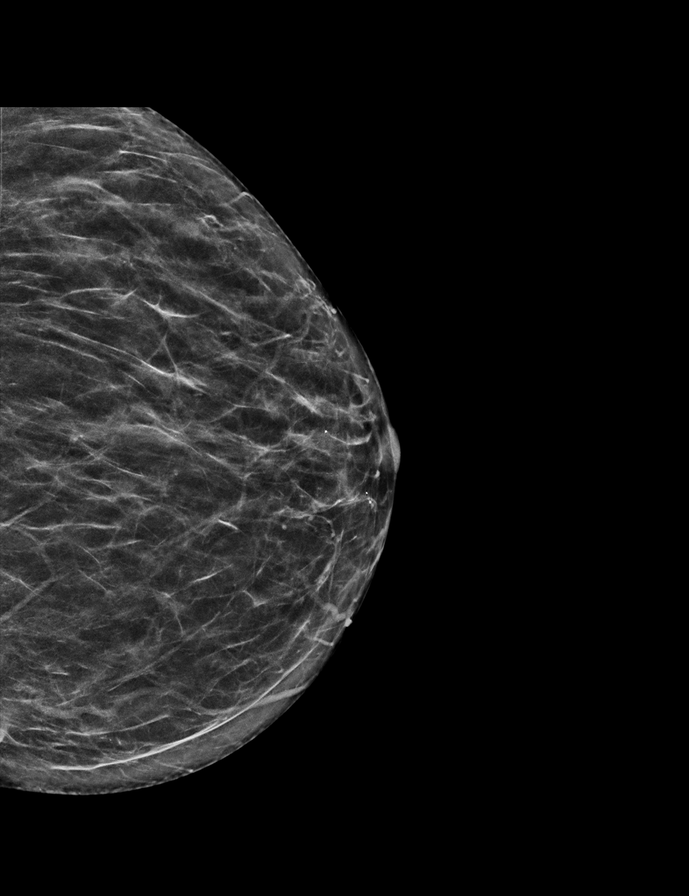

[R MLO synth-2D]
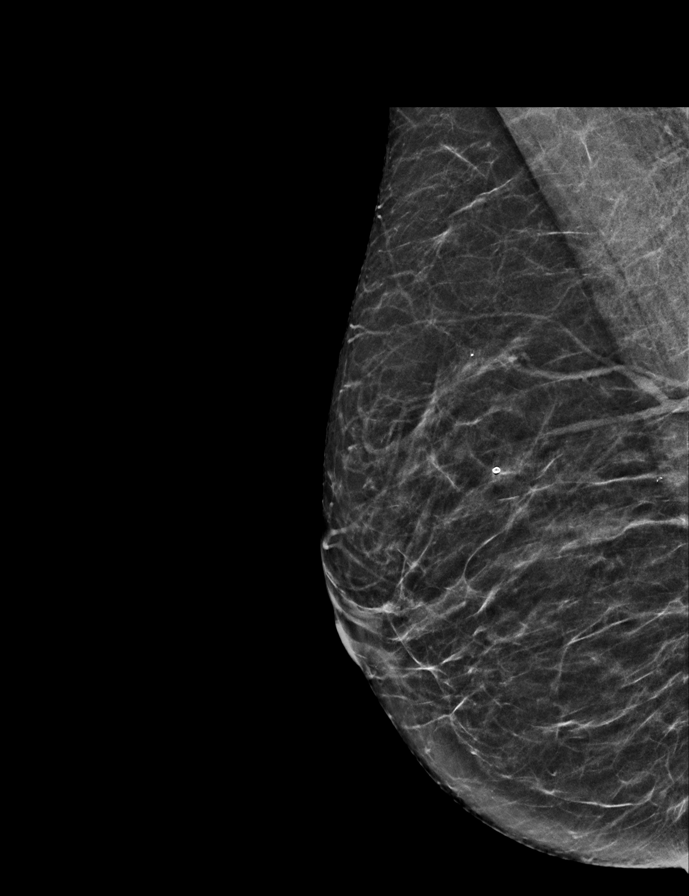

[L MLO synth-2D]
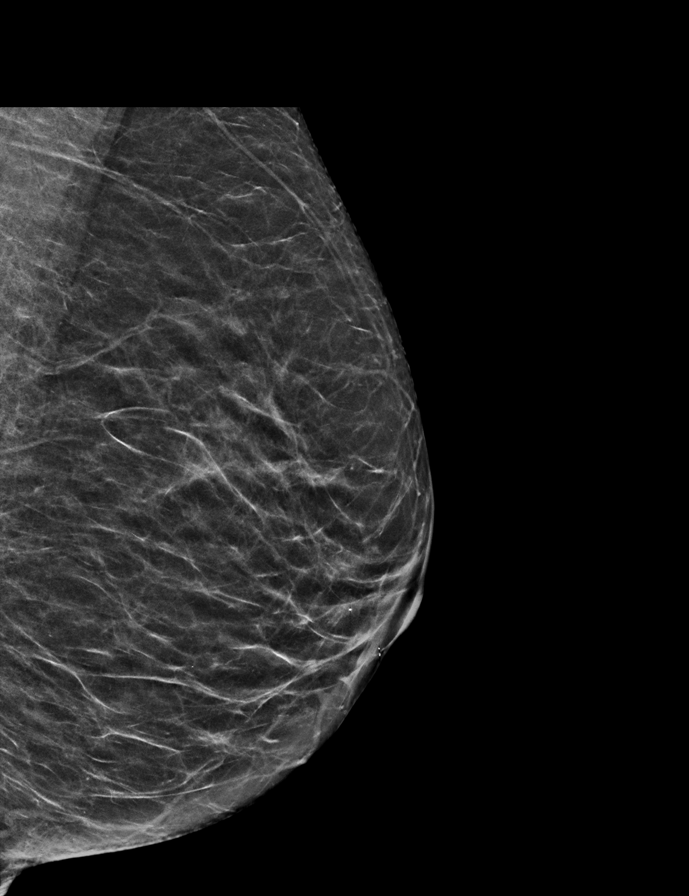

[R CC synth-2D]
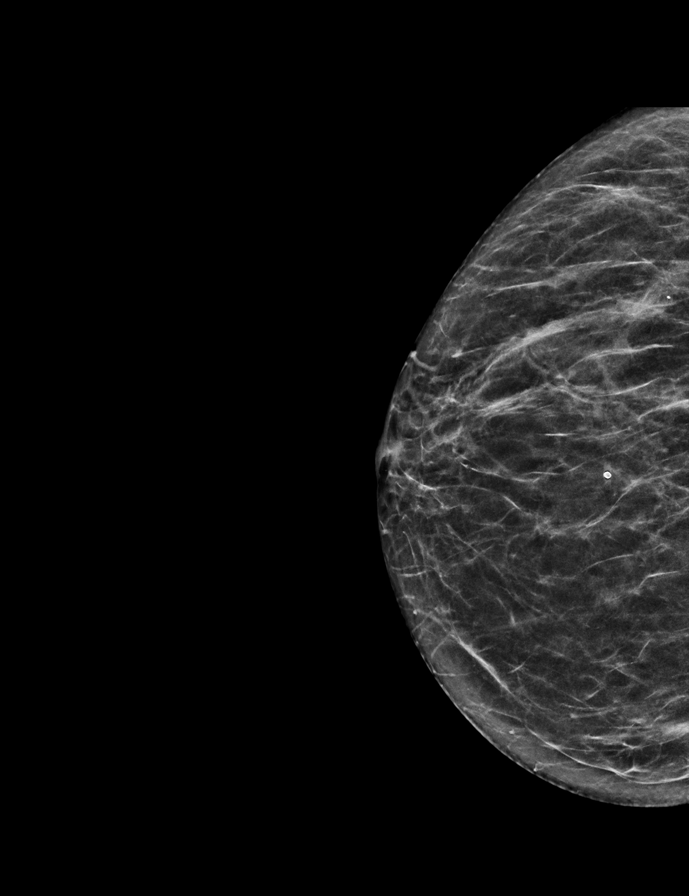

[L CC tomo · 2 of 50 frames shown]
[frame 17/50]
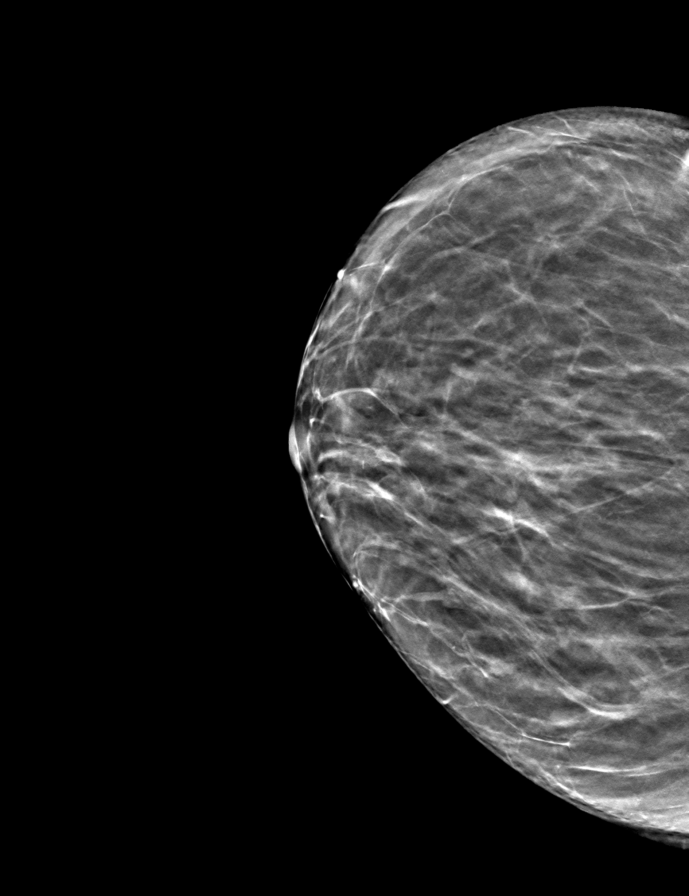
[frame 25/50]
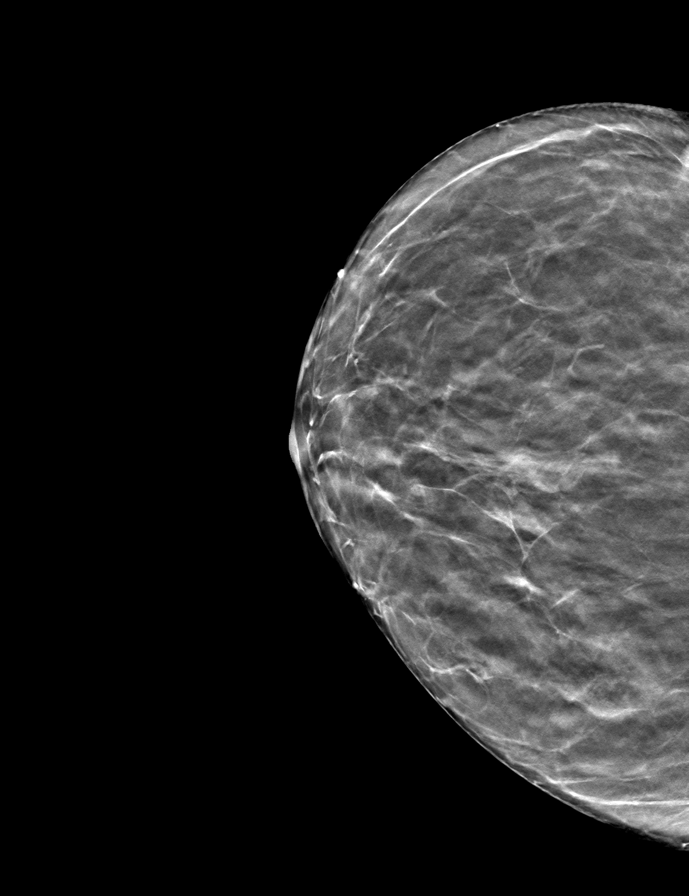

[L MLO tomo · tomo slice 25/49.0]
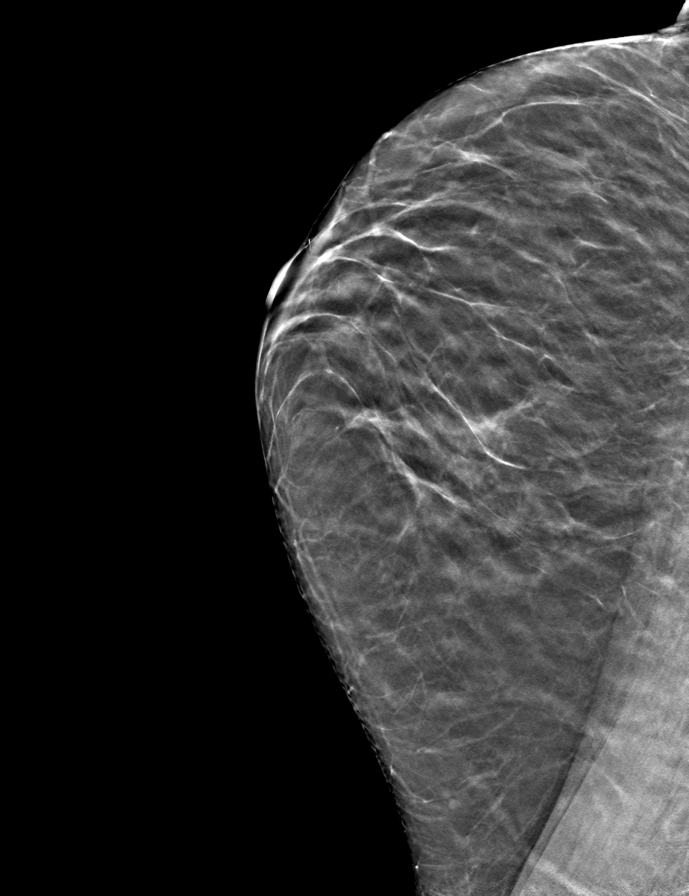

[R CC tomo · tomo slice 25/49.0]
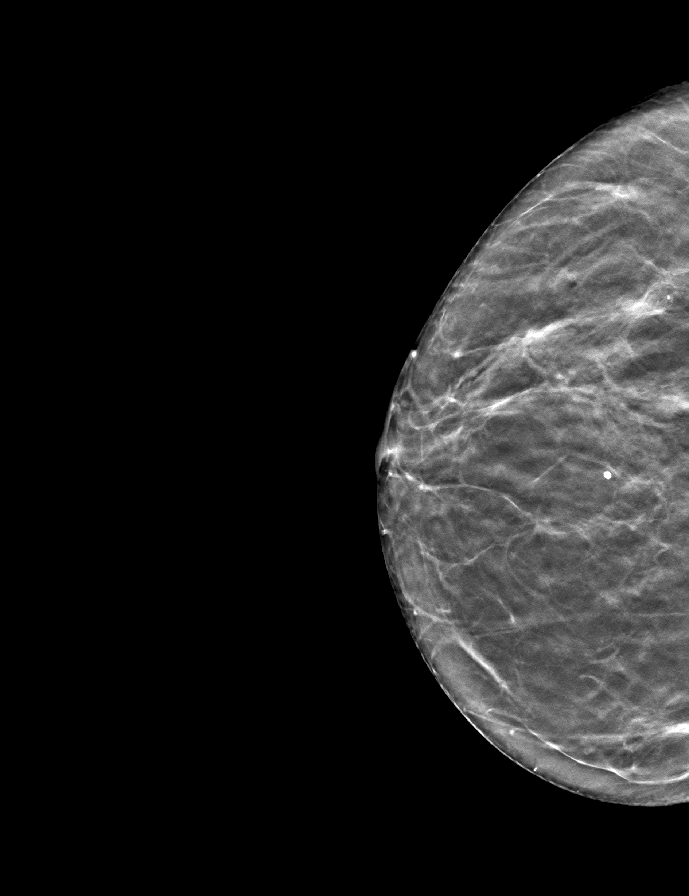

[R MLO tomo · tomo slice 25/48.0]
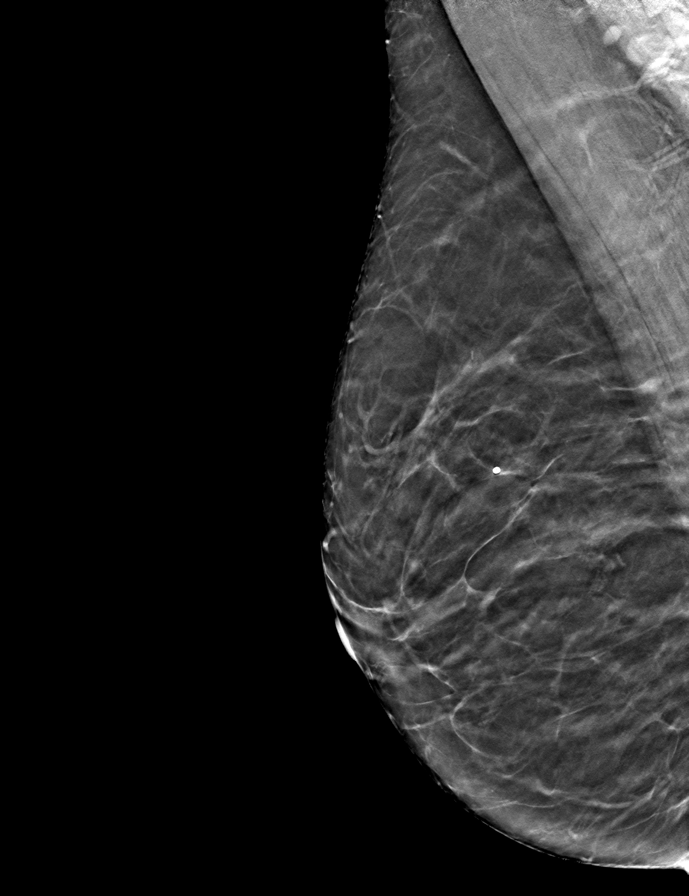

[9 of 24 positions shown; findings below may reference images not displayed]

ACR Breast Density Category b: There are scattered areas of
fibroglandular density.
FINDINGS: There are no findings suspicious for malignancy.
IMPRESSION: No mammographic evidence of malignancy. A result letter of this
screening mammogram will be mailed directly to the patient.

RECOMMENDATION:
Screening mammogram in one year. (Code:51-O-LD2)

BI-RADS CATEGORY  1: Negative.

## 2023-07-20 ENCOUNTER — Ambulatory Visit: Payer: Managed Care, Other (non HMO) | Admitting: Neurology

## 2023-07-21 ENCOUNTER — Ambulatory Visit: Payer: Managed Care, Other (non HMO) | Admitting: Neurology

## 2023-07-23 ENCOUNTER — Other Ambulatory Visit (HOSPITAL_COMMUNITY): Payer: Self-pay

## 2023-07-28 ENCOUNTER — Ambulatory Visit: Payer: Managed Care, Other (non HMO) | Admitting: Neurology

## 2023-07-28 DIAGNOSIS — G43019 Migraine without aura, intractable, without status migrainosus: Secondary | ICD-10-CM

## 2023-07-28 MED ORDER — ONABOTULINUMTOXINA 200 UNITS IJ SOLR
155.0000 [IU] | Freq: Once | INTRAMUSCULAR | Status: AC
  Administered 2023-07-28: 155 [IU] via INTRAMUSCULAR

## 2023-07-28 NOTE — Progress Notes (Signed)
   BOTOX PROCEDURE NOTE FOR MIGRAINE HEADACHE  HISTORY: Here for Botox, last was 04/27/23 with me. Migraines are so much better. In the last 3 months has only had 2 migraines!! Her life is so different.  Takes Flexeril once daily a migraine preventative, for neck pain. Has not needed Nurtec. For the 2 migraines, she took Flexeril with great benefit.   Description of procedure:  The patient was placed in a sitting position. The standard protocol was used for Botox as follows, with 5 units of Botox injected at each site:  -Procerus muscle, midline injection  -Corrugator muscle, bilateral injection  -Frontalis muscle, bilateral injection, with 2 sites each side, medial injection was performed in the upper one third of the frontalis muscle, in the region vertical from the medial inferior edge of the superior orbital rim. The lateral injection was again in the upper one third of the forehead vertically above the lateral limbus of the cornea, 1.5 cm lateral to the medial injection site.  -Temporalis muscle injection, 4 sites, bilaterally. The first injection was 3 cm above the tragus of the ear, second injection site was 1.5 cm to 3 cm up from the first injection site in line with the tragus of the ear. The third injection site was 1.5-3 cm forward between the first 2 injection sites. The fourth injection site was 1.5 cm posterior to the second injection site.  -Occipitalis muscle injection, 3 sites, bilaterally. The first injection was done one half way between the occipital protuberance and the tip of the mastoid process behind the ear. The second injection site was done lateral and superior to the first, 1 fingerbreadth from the first injection. The third injection site was 1 fingerbreadth superiorly and medially from the first injection site.  -Cervical paraspinal muscle injection, 2 sites, bilateral, the first injection site was 1 cm from the midline of the cervical spine, 3 cm inferior to the  lower border of the occipital protuberance. The second injection site was 1.5 cm superiorly and laterally to the first injection site.  -Trapezius muscle injection was performed at 3 sites, bilaterally. The first injection site was in the upper trapezius muscle halfway between the inflection point of the neck, and the acromion. The second injection site was one half way between the acromion and the first injection site. The third injection was done between the first injection site and the inflection point of the neck.  A 200 unit bottle of Botox was used, 155 units were injected, the rest of the Botox was wasted. The patient tolerated the procedure well, there were no complications of the above procedure.  Botox NDC 1610-9604-54 Lot number [id number] Expiration date 09/2025 SP  Doing great with Botox! Will continue every 14-month injections for migraine prevention.  Will continue Flexeril 10 mg daily for migraine preventative.  Has Nurtec if needed.

## 2023-07-28 NOTE — Progress Notes (Signed)
 Botox- 200 units x 1 vial Lot: [id number] Expiration: 09/2025 NDC: 0865-7846-96  Bacteriostatic 0.9% Sodium Chloride- * mL  Lot: [id number] Expiration: 04/01/2024 NDC: 1324-4010-27  Dx: O53.664 S/P  Witnessed by Berna Spare, CMA

## 2023-10-26 ENCOUNTER — Other Ambulatory Visit (HOSPITAL_COMMUNITY): Payer: Self-pay

## 2023-10-26 ENCOUNTER — Ambulatory Visit: Payer: Managed Care, Other (non HMO) | Admitting: Neurology

## 2023-10-26 ENCOUNTER — Telehealth: Payer: Self-pay | Admitting: Pharmacist

## 2023-10-26 VITALS — BP 161/95

## 2023-10-26 DIAGNOSIS — G43719 Chronic migraine without aura, intractable, without status migrainosus: Secondary | ICD-10-CM

## 2023-10-26 DIAGNOSIS — G500 Trigeminal neuralgia: Secondary | ICD-10-CM

## 2023-10-26 DIAGNOSIS — G43019 Migraine without aura, intractable, without status migrainosus: Secondary | ICD-10-CM | POA: Diagnosis not present

## 2023-10-26 MED ORDER — NURTEC 75 MG PO TBDP: 75.0000 mg | ORAL_TABLET | ORAL | 11 refills | Status: AC | PRN

## 2023-10-26 MED ORDER — ONABOTULINUMTOXINA 200 UNITS IJ SOLR
155.0000 [IU] | Freq: Once | INTRAMUSCULAR | Status: AC
  Administered 2023-10-26: 155 [IU] via INTRAMUSCULAR

## 2023-10-26 NOTE — Progress Notes (Signed)
 Botox - 200 units x 1 vial Lot: [id number] Expiration: 2027/10 NDC: 0023-3921-02  Bacteriostatic 0.9% Sodium Chloride - 4 mL  Lot: [id number] Expiration: 04/01/24 NDC: 8413244010  Dx: G43.019  S/P  Witnessed by SYDNEY

## 2023-10-26 NOTE — Telephone Encounter (Signed)
 Pharmacy Patient Advocate Encounter   Received notification from CoverMyMeds that prior authorization for Nurtec 75MG  dispersible tablets is required/requested.   Insurance verification completed.   The patient is insured through [employer] .   Per test claim: PA required; PA submitted to above mentioned insurance via CoverMyMeds Key/confirmation #/EOC [facility] Status is pending

## 2023-10-26 NOTE — Progress Notes (Signed)
   BOTOX  PROCEDURE NOTE FOR MIGRAINE HEADACHE  HISTORY: Gabriella Kerr is here for Botox . Last was 07/28/23 with me.  During April 7-8 had pressure headaches. Thinks are sinus related with weather change. Sudafed helps. Often can be early morning.   Description of procedure:  The patient was placed in a sitting position. The standard protocol was used for Botox  as follows, with 5 units of Botox  injected at each site:   -Procerus muscle, midline injection  -Corrugator muscle, bilateral injection  -Frontalis muscle, bilateral injection, with 2 sites each side, medial injection was performed in the upper one third of the frontalis muscle, in the region vertical from the medial inferior edge of the superior orbital rim. The lateral injection was again in the upper one third of the forehead vertically above the lateral limbus of the cornea, 1.5 cm lateral to the medial injection site.  -Temporalis muscle injection, 4 sites, bilaterally. The first injection was 3 cm above the tragus of the ear, second injection site was 1.5 cm to 3 cm up from the first injection site in line with the tragus of the ear. The third injection site was 1.5-3 cm forward between the first 2 injection sites. The fourth injection site was 1.5 cm posterior to the second injection site.  -Occipitalis muscle injection, 3 sites, bilaterally. The first injection was done one half way between the occipital protuberance and the tip of the mastoid process behind the ear. The second injection site was done lateral and superior to the first, 1 fingerbreadth from the first injection. The third injection site was 1 fingerbreadth superiorly and medially from the first injection site.  -Cervical paraspinal muscle injection, 2 sites, bilateral, the first injection site was 1 cm from the midline of the cervical spine, 3 cm inferior to the lower border of the occipital protuberance. The second injection site was 1.5 cm superiorly and laterally to  the first injection site.  -Trapezius muscle injection was performed at 3 sites, bilaterally. The first injection site was in the upper trapezius muscle halfway between the inflection point of the neck, and the acromion. The second injection site was one half way between the acromion and the first injection site. The third injection was done between the first injection site and the inflection point of the neck.  A 200 unit bottle of Botox  was used, 155 units were injected, the rest of the Botox  was wasted. The patient tolerated the procedure well, there were no complications of the above procedure.  Botox  NDC 0981-1914-78 Lot number [id number] Expiration date 10/27 SP  Meds ordered this encounter  Medications   botulinum toxin Type A  (BOTOX ) injection 155 Units    Botox - 200 units x 1 vial Lot: [id number] Expiration: 2027/10 NDC: 0023-3921-02  Bacteriostatic 0.9% Sodium Chloride - 4 mL  Lot: [id number] Expiration: 04/01/24 NDC: 4132440102  Dx: G43.019  S/P  Witnessed by SYDNEY   Rimegepant Sulfate (NURTEC) 75 MG TBDP    Sig: Take 1 tablet (75 mg total) by mouth as needed.    Dispense:  8 tablet    Refill:  11

## 2023-10-26 NOTE — Telephone Encounter (Signed)
 Pharmacy Patient Advocate Encounter  Received notification from [employer] that Prior Authorization for Nurtec 75MG  dispersible tablets has been APPROVED from 09/26/2023 to 10/25/2024   PA #/Case ID/Reference #: [id number]

## 2023-11-21 ENCOUNTER — Other Ambulatory Visit: Payer: Self-pay | Admitting: Neurology

## 2023-12-30 ENCOUNTER — Telehealth: Payer: Self-pay | Admitting: Neurology

## 2023-12-30 NOTE — Telephone Encounter (Signed)
 Completed [employer] PA form and faxed with notes to [phone].

## 2024-01-06 NOTE — Telephone Encounter (Signed)
 Received approval, pt will continue to fill through [facility].  auth#: [id number] (01/22/24-01/20/25)

## 2024-01-20 ENCOUNTER — Ambulatory Visit: Admitting: Neurology

## 2024-01-26 ENCOUNTER — Encounter: Payer: Self-pay | Admitting: Neurology

## 2024-01-26 ENCOUNTER — Ambulatory Visit: Admitting: Neurology

## 2024-01-26 DIAGNOSIS — G43019 Migraine without aura, intractable, without status migrainosus: Secondary | ICD-10-CM

## 2024-01-26 DIAGNOSIS — G43719 Chronic migraine without aura, intractable, without status migrainosus: Secondary | ICD-10-CM

## 2024-01-26 DIAGNOSIS — G500 Trigeminal neuralgia: Secondary | ICD-10-CM

## 2024-01-26 MED ORDER — ONABOTULINUMTOXINA 200 UNITS IJ SOLR
155.0000 [IU] | Freq: Once | INTRAMUSCULAR | Status: AC
  Administered 2024-01-26: 155 [IU] via INTRAMUSCULAR

## 2024-01-26 NOTE — Progress Notes (Signed)
   BOTOX  PROCEDURE NOTE FOR MIGRAINE HEADACHE  HISTORY: Gabriella Kerr is here for Botox . Last was 10/26/23 with me. 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 Few sinuses headaches, has only taken Nurtec 3 times. Equates sinus headaches with tings mostly frontal, right scalp can be sensitive. Will take Flexeril  as needed, with good benefit. Sinus headache can be few times a week. She can tell when the Botox  is wearing off can have more ting headache.   Description of procedure:  The patient was placed in a sitting position. The standard protocol was used for Botox  as follows, with 5 units of Botox  injected at each site:  -Procerus muscle, midline injection  -Corrugator muscle, bilateral injection  -Frontalis muscle, bilateral injection, with 2 sites each side, medial injection was performed in the upper one third of the frontalis muscle, in the region vertical from the medial inferior edge of the superior orbital rim. The lateral injection was again in the upper one third of the forehead vertically above the lateral limbus of the cornea, 1.5 cm lateral to the medial injection site.  -Temporalis muscle injection, 4 sites, bilaterally. The first injection was 3 cm above the tragus of the ear, second injection site was 1.5 cm to 3 cm up from the first injection site in line with the tragus of the ear. The third injection site was 1.5-3 cm forward between the first 2 injection sites. The fourth injection site was 1.5 cm posterior to the second injection site.  -Occipitalis muscle injection, 3 sites, bilaterally. The first injection was done one half way between the occipital protuberance and the tip of the mastoid process behind the ear. The second injection site was done lateral and superior to the first, 1 fingerbreadth from the first injection. The third injection site was 1 fingerbreadth superiorly and medially from the first injection  site.  -Cervical paraspinal muscle injection, 2 sites, bilateral, the first injection site was 1 cm from the midline of the cervical spine, 3 cm inferior to the lower border of the occipital protuberance. The second injection site was 1.5 cm superiorly and laterally to the first injection site.  -Trapezius muscle injection was performed at 3 sites, bilaterally. The first injection site was in the upper trapezius muscle halfway between the inflection point of the neck, and the acromion. The second injection site was one half way between the acromion and the first injection site. The third injection was done between the first injection site and the inflection point of the neck.   A 200 unit bottle of Botox  was used, 155 units were injected, the rest of the Botox  was wasted. The patient tolerated the procedure well, there were no complications of the above procedure.  Botox  NDC 9976-6078-97 Lot number [id number] Expiration date 07/2026 SP  We talked about taking a Botox  break due to cost. She has to pay $500 every 3 months. We talked about starting Qulipta  for migraine prevention and stopping Botox . Continue Flexeril  and Nurtec PRN. She will consider Qulipta  and let me know her decision.

## 2024-01-26 NOTE — Progress Notes (Signed)
 Botox - 200 units x 1 vial Lot: [id number] Expiration: 2028/03 NDC: 0023-3921-02  Bacteriostatic 0.9% Sodium Chloride - 4 mL  Lot: [id number] Expiration: 03/31/25 NDC: 9590803397  Dx:  H56.980  G43.719  G50.0  S/P  Witnessed by DELENA MOLT RN

## 2024-04-03 ENCOUNTER — Other Ambulatory Visit: Payer: Self-pay | Admitting: Neurology

## 2024-05-02 ENCOUNTER — Encounter: Payer: Self-pay | Admitting: Neurology

## 2024-05-02 ENCOUNTER — Ambulatory Visit: Admitting: Neurology

## 2024-05-15 ENCOUNTER — Other Ambulatory Visit: Payer: Self-pay | Admitting: Neurology

## 2024-05-17 ENCOUNTER — Encounter: Payer: Self-pay | Admitting: Neurology

## 2024-05-17 MED ORDER — CYCLOBENZAPRINE HCL 10 MG PO TABS: 10.0000 mg | ORAL_TABLET | Freq: Three times a day (TID) | ORAL | 0 refills | Status: DC | PRN

## 2024-05-17 NOTE — Telephone Encounter (Signed)
 Last seen on 01/26/24 Follow up scheduled on 06/06/24

## 2024-06-06 ENCOUNTER — Ambulatory Visit: Admitting: Neurology

## 2024-06-06 VITALS — BP 115/75 | HR 91

## 2024-06-06 DIAGNOSIS — G43719 Chronic migraine without aura, intractable, without status migrainosus: Secondary | ICD-10-CM | POA: Diagnosis not present

## 2024-06-06 DIAGNOSIS — G43019 Migraine without aura, intractable, without status migrainosus: Secondary | ICD-10-CM

## 2024-06-06 MED ORDER — ONABOTULINUMTOXINA 200 UNITS IJ SOLR
155.0000 [IU] | Freq: Once | INTRAMUSCULAR | Status: AC
  Administered 2024-06-06: 155 [IU] via INTRAMUSCULAR

## 2024-06-06 NOTE — Progress Notes (Signed)
" ° °  BOTOX  PROCEDURE NOTE FOR MIGRAINE HEADACHE   HISTORY: Gabriella Kerr is here for Botox . Last was 01/26/24 with me. She is overdue for Botox , was sick for her last appointment. More headaches, feel a little different than migraine, more to top of head. Has Nurtec, it helps, but makes her sleepy, but can't sleep. Takes Flexeril  10 mg at bedtime for prevention. Has been following with her PCP for HTN.   Description of procedure: The patient was placed in a sitting position. The standard protocol was used for Botox  as follows, with 5 units of Botox  injected at each site:   -Procerus muscle, midline injection  -Corrugator muscle, bilateral injection  -Frontalis muscle, bilateral injection, with 2 sites each side, medial injection was performed in the upper one third of the frontalis muscle, in the region vertical from the medial inferior edge of the superior orbital rim. The lateral injection was again in the upper one third of the forehead vertically above the lateral limbus of the cornea, 1.5 cm lateral to the medial injection site.  -Temporalis muscle injection, 4 sites, bilaterally. The first injection was 3 cm above the tragus of the ear, second injection site was 1.5 cm to 3 cm up from the first injection site in line with the tragus of the ear. The third injection site was 1.5-3 cm forward between the first 2 injection sites. The fourth injection site was 1.5 cm posterior to the second injection site.  -Occipitalis muscle injection, 3 sites, bilaterally. The first injection was done one half way between the occipital protuberance and the tip of the mastoid process behind the ear. The second injection site was done lateral and superior to the first, 1 fingerbreadth from the first injection. The third injection site was 1 fingerbreadth superiorly and medially from the first injection site.  -Cervical paraspinal muscle injection, 2 sites, bilateral, the first injection site was 1 cm from the  midline of the cervical spine, 3 cm inferior to the lower border of the occipital protuberance. The second injection site was 1.5 cm superiorly and laterally to the first injection site.  -Trapezius muscle injection was performed at 3 sites, bilaterally. The first injection site was in the upper trapezius muscle halfway between the inflection point of the neck, and the acromion. The second injection site was one half way between the acromion and the first injection site. The third injection was done between the first injection site and the inflection point of the neck.   A 200 unit bottle of Botox  was used, 155 units were injected, the rest of the Botox  was wasted. The patient tolerated the procedure well, there were no complications of the above procedure.  Botox  NDC 9976-6078-97 Lot number [id number] Expiration date 07/2026 SP   "

## 2024-06-06 NOTE — Progress Notes (Signed)
 Botox - 200 units x 1 vial Lot: [id number] Expiration: 07/2026 NDC: 9976-6078-97  Bacteriostatic 0.9% Sodium Chloride - 4 mL  Lot: [id number] Expiration: MAR-31-2027 NDC: 9590-8033-97  Dx:G43.179 S/P Witnessed by:Buddy Dustman, CMA

## 2024-06-06 NOTE — Progress Notes (Signed)
 "   Patient: Gabriella Kerr Date of Birth: 26-May-1961  Reason for Visit: Follow up History from: Patient Primary Neurologist: Chima/Yan   ASSESSMENT AND PLAN 64 y.o. year old female with a history of hypothyroidism, right trigeminal neuralgia s/p microvascular decompression and gamma knife, pituitary adenoma s/p resection, kidney stones, anxiety   Chronic migraine headache  - Excellent benefits with Botox , continue every 3 months - Continue Nurtec 75 mg as needed - Continue Flexeril  10 mg up to nightly for migraine prevention  - Follow up for next Botox   HISTORY OF PRESENT ILLNESS: Today 06/06/2024 06/06/24 SS: Here for Botox , established at our office in May 2023 from outside headache Center to continue Botox .  Migraines have done excellent with Botox  every 3 months for prevention.  Wishes to continue Botox .  She also takes Flexeril  nightly for migraine preventative, helps with neck tension that triggers migraine.  She uses Nurtec as needed. Is overdue for Botox  due to illness, noticing more headache.   HPI:  Medical co-morbidities: hypothyroidism, right trigeminal neuralgia s/p microvascular decompression and gamma knife, pituitary adenoma s/p resection, kidney stones, anxiety   The patient presents for evaluation of headaches which began in high school. They have ebbed and flowed over time. She had MVD for trigeminal neuralgia last year which has helped reduce her headaches somewhat. Prior to surgery she had daily headaches, now she is down to 2 headaches per week. Headaches last 12 hours at a time. They are described as right retro-orbital shooting pain with associated photophobia, phonophobia, and nausea.   She has tried and failed multiple rescue medications. Currently she uses Flexeril  and Soma as needed which do help reduce her pain but do not resolve it. She takes Cymbalta  and does Botox  for headache prevention.    Previously followed with [facility]. Her previous doctor  left and she presents to establish care.   Headache History: Onset: high school Triggers: allergies Aura: no Location: right retro-orbital, ear Quality/Description: shooting  Associated Symptoms:             Photophobia: yes             Phonophobia: yes             Nausea: yes Worse with activity?: yes Duration of headaches: 12 hours     Headache days per month: 8 Headache free days per month: 22   Current Treatment: Abortive Soma, Flexeril    Preventative Cymbalta  60 mg daily Botox    Prior Therapies                                 Gabapentin Topamax (kidney stones) Zonisamide Carbamazepine Oxcarbazepine  Cymbalta  Effexor Elavil Emgality Aimovig Botox    Rescue: Toradol  Indomethacin diclofenac Soma Flexeril  Imitrex Maxalt Relpax Ubrelvy Nurtec Reyvow Migranal Nasal lidocaine Phenergan Reglan Zofran    Neck PT  REVIEW OF SYSTEMS: Out of a complete 14 system review of symptoms, the patient complains only of the following symptoms, and all other reviewed systems are negative.  See HPI  ALLERGIES: Allergies[1]  HOME MEDICATIONS: Outpatient Medications Prior to Visit  Medication Sig Dispense Refill   alendronate (FOSAMAX) 5 MG tablet Take 5 mg by mouth daily before breakfast. Take with a full glass of water on an empty stomach.     atorvastatin (LIPITOR) 40 MG tablet Take by mouth.     BOTOX  200 units injection INJECT 200 UNITS INTO THE MUSCLE EVERY 3 MONTHS. DISCARD  UNUSED PORTION. (MUST RECONSTITUTE) 1 each 4   carisoprodol (SOMA) 350 MG tablet   0   cyclobenzaprine  (FLEXERIL ) 10 MG tablet Take 1 tablet (10 mg total) by mouth 3 (three) times daily as needed for muscle spasms. 90 tablet 0   DULoxetine  (CYMBALTA ) 60 MG capsule Take 1 tablet by mouth daily.     fluticasone (FLONASE) 50 MCG/ACT nasal spray Place 2 sprays into both nostrils daily.     lisdexamfetamine  (VYVANSE ) 40 MG capsule Take 1 capsule (40 mg total) by mouth every morning. 90  capsule 0   losartan (COZAAR) 25 MG tablet Take 25 mg by mouth daily.     Rimegepant Sulfate (NURTEC) 75 MG TBDP Take 1 tablet (75 mg total) by mouth as needed. 8 tablet 11   SYNTHROID 75 MCG tablet      zolpidem (AMBIEN) 5 MG tablet Take 5 mg by mouth at bedtime as needed.       No facility-administered medications prior to visit.    PAST MEDICAL HISTORY: Past Medical History:  Diagnosis Date   Allergy    Chronic kidney disease    kidney stones   Kidney stones    Migraine    Osteopenia    Thyroid disease    hypothryoid   Trigeminal neuralgia     PAST SURGICAL HISTORY: Past Surgical History:  Procedure Laterality Date   CESAREAN SECTION     x3   CHOLECYSTECTOMY     LITHOTRIPSY     pituary surgery     trigeminal neuralgia surgery  11/22/2020   Dr Nancye    FAMILY HISTORY: Family History  Problem Relation Age of Onset   Migraines Mother    Migraines Maternal Grandmother    Colon cancer Maternal Grandfather    Depression Maternal Grandfather    Esophageal cancer Neg Hx    Rectal cancer Neg Hx    Stomach cancer Neg Hx     SOCIAL HISTORY: Social History   Socioeconomic History   Marital status: Married    Spouse name: Not on file   Number of children: 3   Years of education: Not on file   Highest education level: Bachelor's degree (e.g., BA, AB, BS)  Occupational History   Not on file  Tobacco Use   Smoking status: Never   Smokeless tobacco: Never  Substance and Sexual Activity   Alcohol use: No    Alcohol/week: 0.0 standard drinks of alcohol   Drug use: No   Sexual activity: Yes    Birth control/protection: None  Other Topics Concern   Not on file  Social History Narrative   Lives with husband   Caffeine- 8-10 oz a day   Right handed   Retired-artist   Social Drivers of Health   Tobacco Use: Low Risk (05/10/2024)   Received from [facility]   Patient History    Smoking Tobacco Use: Never    Smokeless Tobacco Use: Never    Passive  Exposure: Not on file  Financial Resource Strain: Low Risk (08/13/2021)   Received from [facility]   Overall Financial Resource Strain (CARDIA)    Difficulty of Paying Living Expenses: Not hard at all  Food Insecurity: Low Risk (08/16/2023)   Received from [facility]   Epic    Within the past 12 months, you worried that your food would run out before you got money to buy more: Never true    Within the past 12 months, the food you bought just didn't last and  you didn't have money to get more. : Never true  Transportation Needs: No Transportation Needs (08/16/2023)   Received from [facility]   Transportation    In the past 12 months, has lack of reliable transportation kept you from medical appointments, meetings, work or from getting things needed for daily living? : No  Physical Activity: Insufficiently Active (08/13/2021)   Received from [facility] visits prior to 08/01/2022., [facility]   Exercise Vital Sign    On average, how many days per week do you engage in moderate to strenuous exercise (like a brisk walk)?: 3 days    On average, how many minutes do you engage in exercise at this level?: 30 min  Stress: No Stress Concern Present (08/13/2021)   Received from [facility] visits prior to 08/01/2022., [facility]   [employer] of Occupational [employer] - Occupational Stress Questionnaire    Feeling of Stress : Only a little  Social Connections: Unknown (09/30/2021)   Received from [facility]   Social Network    Social Network: Not on file  Intimate Partner Violence: Unknown (09/04/2021)   Received from [facility]   HITS    Physically Hurt: Not on file    Insult or Talk Down To: Not on file    Threaten Physical Harm: Not on file    Scream or Curse: Not on file  Depression (EYV7-0): Not on file  Alcohol Screen: Not on file  Housing: Low Risk (08/16/2023)   Received from [facility]   Epic    What is your living situation  today?: I have a steady place to live    Think about the place you live. Do you have problems with any of the following? Choose all that apply:: None/None on this list  Utilities: Low Risk (08/16/2023)   Received from [facility]   Utilities    In the past 12 months has the electric, gas, oil, or water company threatened to shut off services in your home? : No  Health Literacy: Not on file    PHYSICAL EXAM  Vitals:   06/06/24 0808  BP: 115/75  Pulse: 91   There is no height or weight on file to calculate BMI.  Generalized: Well developed, in no acute distress  Neurological examination  Mentation: Alert oriented to time, place, history taking. Follows all commands speech and language fluent Cranial nerve II-XII: Pupils were equal round reactive to light. Extraocular movements were full, visual field were full on confrontational test. Facial sensation and strength were normal. . Head turning and shoulder shrug  were normal and symmetric. Motor: The motor testing reveals 5 over 5 strength of all 4 extremities. Good symmetric motor tone is noted throughout.  Sensory: Sensory testing is intact to soft touch on all 4 extremities. No evidence of extinction is noted.   Gait and station: Gait is normal.    DIAGNOSTIC DATA (LABS, IMAGING, TESTING) - I reviewed patient records, labs, notes, testing and imaging myself where available.  Lab Results  Component Value Date   WBC 4.7 08/10/2013   HGB 12.0 08/10/2013   HCT 36.1 08/10/2013   MCV 91.6 08/10/2013   PLT 205 08/10/2013      Component Value Date/Time   NA 140 01/25/2011 0020   K 3.7 01/25/2011 0020   CL 110 01/25/2011 0020   CO2 21 01/25/2011 0020   GLUCOSE 123 (H) 01/25/2011 0020   BUN 22 01/25/2011 0020   CREATININE  0.80 01/25/2011 0020   CALCIUM 8.9 01/25/2011 0020   GFRNONAA >60 01/25/2011 0020   GFRAA >60 01/25/2011 0020   No results found for: CHOL, HDL, LDLCALC, LDLDIRECT, TRIG, CHOLHDL Lab Results   Component Value Date   HGBA1C 5.4 08/10/2013   No results found for: VITAMINB12 No results found for: TSH  Lauraine Born, AGNP-C, DNP 06/06/2024, 8:43 AM [facility] [address], [address] [place], [place] [address] [phone]      [1]  Allergies Allergen Reactions   Ciprofloxacin Hives   Penicillins Swelling   Metaxalone Nausea Only    Severe headache- one time only   Doxycycline Nausea Only    Nausea. Feeling sick Nausea. Feeling sick Nausea. Feeling sick Nausea. Feeling sick    "

## 2024-06-22 ENCOUNTER — Other Ambulatory Visit: Payer: Self-pay | Admitting: Neurology

## 2024-07-26 ENCOUNTER — Ambulatory Visit: Admitting: Neurology

## 2024-08-31 ENCOUNTER — Ambulatory Visit: Admitting: Neurology

## 2024-09-12 ENCOUNTER — Ambulatory Visit: Admitting: Neurology
